# Patient Record
Sex: Female | Born: 1935 | Race: White | Hispanic: No | Marital: Married | Smoking: Former smoker
Health system: Southern US, Community
[De-identification: ages and names within clinical notes are randomized; demographics above are authoritative.]

## PROBLEM LIST (undated history)

## (undated) DIAGNOSIS — H8110 Benign paroxysmal vertigo, unspecified ear: Secondary | ICD-10-CM

## (undated) DIAGNOSIS — D649 Anemia, unspecified: Secondary | ICD-10-CM

## (undated) DIAGNOSIS — I4891 Unspecified atrial fibrillation: Secondary | ICD-10-CM

## (undated) DIAGNOSIS — I82409 Acute embolism and thrombosis of unspecified deep veins of unspecified lower extremity: Secondary | ICD-10-CM

## (undated) DIAGNOSIS — R58 Hemorrhage, not elsewhere classified: Secondary | ICD-10-CM

## (undated) DIAGNOSIS — N281 Cyst of kidney, acquired: Secondary | ICD-10-CM

## (undated) DIAGNOSIS — K219 Gastro-esophageal reflux disease without esophagitis: Secondary | ICD-10-CM

## (undated) DIAGNOSIS — I341 Nonrheumatic mitral (valve) prolapse: Secondary | ICD-10-CM

## (undated) DIAGNOSIS — R079 Chest pain, unspecified: Secondary | ICD-10-CM

## (undated) DIAGNOSIS — E039 Hypothyroidism, unspecified: Secondary | ICD-10-CM

## (undated) HISTORY — PX: TONSILLECTOMY AND ADENOIDECTOMY: SUR1326

## (undated) HISTORY — DX: Cyst of kidney, acquired: N28.1

## (undated) HISTORY — DX: Unspecified atrial fibrillation: I48.91

## (undated) HISTORY — DX: Acute embolism and thrombosis of unspecified deep veins of unspecified lower extremity: I82.409

## (undated) HISTORY — DX: Gastro-esophageal reflux disease without esophagitis: K21.9

## (undated) HISTORY — PX: ENDOMETRIAL BIOPSY: SHX622

## (undated) HISTORY — DX: Hypothyroidism, unspecified: E03.9

## (undated) HISTORY — DX: Benign paroxysmal vertigo, unspecified ear: H81.10

## (undated) HISTORY — DX: Chest pain, unspecified: R07.9

## (undated) HISTORY — DX: Anemia, unspecified: D64.9

## (undated) HISTORY — DX: Nonrheumatic mitral (valve) prolapse: I34.1

## (undated) HISTORY — DX: Hemorrhage, not elsewhere classified: R58

---

## 1996-12-15 HISTORY — PX: OTHER SURGICAL HISTORY: SHX169

## 2000-12-15 HISTORY — PX: SKIN CANCER EXCISION: SHX779

## 2004-01-05 LAB — HM COLONOSCOPY

## 2004-12-15 HISTORY — PX: CATARACT EXTRACTION: SUR2

## 2005-01-02 ENCOUNTER — Ambulatory Visit: Payer: Self-pay | Admitting: Specialist

## 2005-01-23 ENCOUNTER — Ambulatory Visit: Payer: Self-pay | Admitting: Unknown Physician Specialty

## 2006-02-24 ENCOUNTER — Ambulatory Visit: Payer: Self-pay | Admitting: Unknown Physician Specialty

## 2007-02-26 ENCOUNTER — Ambulatory Visit: Payer: Self-pay | Admitting: Unknown Physician Specialty

## 2007-07-29 ENCOUNTER — Ambulatory Visit: Payer: Self-pay | Admitting: Urology

## 2007-10-16 ENCOUNTER — Ambulatory Visit: Payer: Self-pay | Admitting: Internal Medicine

## 2007-10-16 HISTORY — PX: OTHER SURGICAL HISTORY: SHX169

## 2007-11-07 ENCOUNTER — Inpatient Hospital Stay: Payer: Self-pay | Admitting: Internal Medicine

## 2007-11-10 ENCOUNTER — Ambulatory Visit: Payer: Self-pay | Admitting: Internal Medicine

## 2007-11-15 ENCOUNTER — Ambulatory Visit: Payer: Self-pay | Admitting: Internal Medicine

## 2007-11-18 ENCOUNTER — Other Ambulatory Visit: Payer: Self-pay

## 2007-11-18 ENCOUNTER — Inpatient Hospital Stay: Payer: Self-pay | Admitting: Internal Medicine

## 2007-11-25 ENCOUNTER — Ambulatory Visit: Payer: Self-pay | Admitting: Internal Medicine

## 2007-12-16 ENCOUNTER — Ambulatory Visit: Payer: Self-pay | Admitting: Internal Medicine

## 2007-12-17 ENCOUNTER — Ambulatory Visit: Payer: Self-pay | Admitting: Oncology

## 2007-12-21 LAB — CBC WITH DIFFERENTIAL (CANCER CENTER ONLY)
BASO#: 0.1 10*3/uL (ref 0.0–0.2)
EOS%: 2.7 % (ref 0.0–7.0)
Eosinophils Absolute: 0.2 10*3/uL (ref 0.0–0.5)
HCT: 43 % (ref 34.8–46.6)
HGB: 14.6 g/dL (ref 11.6–15.9)
LYMPH#: 1.6 10*3/uL (ref 0.9–3.3)
MCH: 31.3 pg (ref 26.0–34.0)
MCHC: 33.9 g/dL (ref 32.0–36.0)
NEUT%: 63.7 % (ref 39.6–80.0)
RBC: 4.66 10*6/uL (ref 3.70–5.32)

## 2008-01-03 ENCOUNTER — Ambulatory Visit: Payer: Self-pay | Admitting: Unknown Physician Specialty

## 2008-01-24 ENCOUNTER — Ambulatory Visit: Payer: Self-pay | Admitting: Internal Medicine

## 2008-02-09 ENCOUNTER — Encounter: Payer: Self-pay | Admitting: Internal Medicine

## 2008-02-09 ENCOUNTER — Ambulatory Visit: Payer: Self-pay

## 2008-03-21 ENCOUNTER — Ambulatory Visit: Payer: Self-pay | Admitting: Internal Medicine

## 2008-04-04 ENCOUNTER — Ambulatory Visit: Payer: Self-pay | Admitting: Unknown Physician Specialty

## 2008-05-17 ENCOUNTER — Ambulatory Visit: Payer: Self-pay | Admitting: Internal Medicine

## 2009-04-05 ENCOUNTER — Ambulatory Visit: Payer: Self-pay | Admitting: Unknown Physician Specialty

## 2009-05-08 DIAGNOSIS — I4891 Unspecified atrial fibrillation: Secondary | ICD-10-CM | POA: Insufficient documentation

## 2009-05-08 DIAGNOSIS — R001 Bradycardia, unspecified: Secondary | ICD-10-CM

## 2009-05-08 DIAGNOSIS — R079 Chest pain, unspecified: Secondary | ICD-10-CM

## 2009-05-16 ENCOUNTER — Encounter: Payer: Self-pay | Admitting: Internal Medicine

## 2009-05-16 ENCOUNTER — Ambulatory Visit: Payer: Self-pay | Admitting: Internal Medicine

## 2009-06-21 ENCOUNTER — Encounter: Payer: Self-pay | Admitting: Internal Medicine

## 2009-09-13 ENCOUNTER — Ambulatory Visit: Payer: Self-pay | Admitting: Urology

## 2009-12-17 ENCOUNTER — Ambulatory Visit: Payer: Self-pay | Admitting: Unknown Physician Specialty

## 2010-01-11 ENCOUNTER — Ambulatory Visit: Payer: Self-pay | Admitting: Unknown Physician Specialty

## 2010-04-11 ENCOUNTER — Ambulatory Visit: Payer: Self-pay | Admitting: Unknown Physician Specialty

## 2010-11-08 ENCOUNTER — Ambulatory Visit: Payer: Self-pay | Admitting: Internal Medicine

## 2010-11-08 ENCOUNTER — Encounter: Payer: Self-pay | Admitting: Internal Medicine

## 2010-11-11 LAB — CONVERTED CEMR LAB
BUN: 37 mg/dL — ABNORMAL HIGH (ref 6–23)
CO2: 27 meq/L (ref 19–32)
Glucose, Bld: 94 mg/dL (ref 70–99)
Potassium: 4.5 meq/L (ref 3.5–5.3)
Sodium: 139 meq/L (ref 135–145)

## 2010-11-15 ENCOUNTER — Encounter: Payer: Self-pay | Admitting: Internal Medicine

## 2011-01-14 NOTE — Assessment & Plan Note (Signed)
Summary: ROV   Visit Type:  Follow-up Primary Ana Griffith:  Ana Griffith  CC:  Needs to discuss INR level.Marland Kitchen  History of Present Illness: Ana Griffith is a delightful 77 year old woman (sister of Ana Griffith) with history of chronic atrial fibrillation as well as previous thigh DVT in the setting of full anticoagulation with Coumadin.  She is status post IVC filter placement.  She also has a history of chronic hematuria as well as hypertension with a white coat component.  She also has chronic chest pain.  She had a cardiac catheterization 1999 which showed normal coronary arteries.  Myoview 2011 showed ejection fraction of 72% with no ischemia.   Here for routine f/u. Doing well. No CP or SOB. No palpitations. Remains active. INR level very labile - as high as 6.0. Had a little vaginal bleeding last week but nothing major (had hysteroscopy earlier this year showing thickening of uterus). INR followed by Ana Griffith.   Current Medications (verified): 1)  Warfarin Sodium 5 Mg Tabs (Warfarin Sodium) .... Use As Directed By Anticoagulation Clinic 2)  Metoprolol Succinate 25 Mg Xr24h-Tab (Metoprolol Succinate) .... Take One Tablet By Mouth Daily 3)  Synthroid 75 Mcg Tabs (Levothyroxine Sodium) .Marland Kitchen.. 1 By Mouth Once Daily 4)  Calcium 600/vitamin D 600-400 Mg-Unit Chew (Calcium Carbonate-Vitamin D) .Marland Kitchen.. 1 By Mouth Once Daily  Allergies (verified): 1)  ! Codeine  Past History:  Past Medical History: Last updated: 05/16/2009 CHEST PAIN-UNSPECIFIED (ICD-786.50)    --cardiac cath 1999 normal cors    --myoview 2009. EF 72%  no ischemia ATRIAL FIBRILLATION (ICD-427.31) HYPERTENSION, UNSPECIFIED (ICD-401.9) h/o DVT on coumadin  s/p IVC filter Hematuria Hypothyroidismdsa    Past Surgical History: Last updated: 05/08/2009 Cataract Extraction - 2006 T & A  Family History: Last updated: 05/08/2009 Family History of Coronary Artery Disease:   Social History: Last updated:  05/08/2009 Widowed  Tobacco Use - Former. - 1980 Alcohol Use - no Regular Exercise - yes Drug Use - no Retired   Risk Factors: Exercise: yes (05/08/2009)  Risk Factors: Smoking Status: quit (05/08/2009)  Review of Systems       As per HPI and past medical history; otherwise all systems negative.   Vital Signs:  Patient profile:   77 year old female Height:      65 inches Weight:      134.50 pounds BMI:     22.46 Pulse rate:   77 / minute BP sitting:   100 / 68  (left arm) Cuff size:   regular  Vitals Entered By: Ana Griffith, CMA (November 08, 2010 11:28 AM)  Physical Exam  General:  Well appearing. no resp difficulty HEENT: normal Neck: supple. no JVD. Carotids 2+ bilat; not bruits. No lymphadenopathy or thryomegaly appreciated. Cor: PMI nondisplaced. Irregular rate & rhythm. No rubs, gallops, murmur. Lungs: clear Abdomen: soft, nontender, nondistended. No hepatosplenomegaly. No bruits or masses. Good bowel sounds. Extremities: no cyanosis, clubbing, rash, edema Neuro: alert & orientedx3, cranial nerves grossly intact. moves all 4 extremities w/o difficulty. affect pleasant    Impression & Recommendations:  Problem # 1:  ATRIAL FIBRILLATION (ICD-427.31) Difficult situation. Having trouble with labile INRs but no overt bleeding. We discussed the option of switching to Pradaxa at length but I worry that her risk of bleeding with 150 two times a day dosing may be quite high. For now we will recommend that she continues coumadin with close f/u. We will check creatinine today to calculate creatinine clearance. If clearance  ~54ml/min will  consider using Pradax 75 two times a day if we cannot regulate INRs closely. Eventually, apixiban when it is released may be a better option for anticoagulation given its lower risk of bleeding in clinical trials.   Other Orders: T-Basic Metabolic Panel [phone])  Patient Instructions: 1)  Your physician recommends that you  schedule a follow-up appointment in: 4 months 2)  Your physician recommends that you continue on your current medications as directed. Please refer to the Current Medication list given to you today. Continue on your Coumadin for now.

## 2011-03-11 ENCOUNTER — Ambulatory Visit (INDEPENDENT_AMBULATORY_CARE_PROVIDER_SITE_OTHER): Payer: MEDICARE | Admitting: Internal Medicine

## 2011-03-11 ENCOUNTER — Encounter: Payer: Self-pay | Admitting: Internal Medicine

## 2011-03-11 ENCOUNTER — Ambulatory Visit: Payer: Self-pay | Admitting: Internal Medicine

## 2011-03-11 VITALS — BP 104/68 | HR 60 | Ht 64.0 in | Wt 137.0 lb

## 2011-03-11 DIAGNOSIS — I10 Essential (primary) hypertension: Secondary | ICD-10-CM

## 2011-03-11 DIAGNOSIS — I4891 Unspecified atrial fibrillation: Secondary | ICD-10-CM

## 2011-03-11 DIAGNOSIS — R079 Chest pain, unspecified: Secondary | ICD-10-CM

## 2011-03-11 NOTE — Progress Notes (Signed)
HPI: Ms. Pottenger is a delightful 77 year old woman (sister of [name]) with history of chronic atrial fibrillation as well as previous thigh DVT in the setting of full anticoagulation with Coumadin.  She is status post IVC filter placement.  She also has a history of chronic hematuria as well as hypertension with a white coat component.  She also has chronic chest pain.  She had a cardiac catheterization 1999 which showed normal coronary arteries.  Myoview 2011 showed ejection fraction of 72% with no ischemia.  Here for routine f/u. Doing very well. Denies any CP or SOB. Remains active. INR level remains labile. No further vaginal (or other) bleeding. Dr. Barron Alvine adjusting coumadin. No edema palpitations.    ROS: All systems negative except as listed in HPI, PMH and Problem List.  Past Medical History  Diagnosis Date  . Chest pain, unspecified     cardiac cath 1999 - normal cors. Myoview 2009 EF 72. normal perfusion  . Atrial fibrillation     chronic  . Hypertension   . DVT (deep venous thrombosis)     In setting of full anti-coagulation. IVC filter placed  . Hematuria   . Hypothyroidism     Current Outpatient Prescriptions  Medication Sig Dispense Refill  . Calcium Carbonate-Vitamin D (CALCIUM 600/VITAMIN D) 600-400 MG-UNIT per tablet Take 1 tablet by mouth daily.        Marland Kitchen levothyroxine (SYNTHROID, LEVOTHROID) 75 MCG tablet Take 75 mcg by mouth daily.        . metoprolol succinate (TOPROL-XL) 25 MG 24 hr tablet Take 25 mg by mouth daily.        Marland Kitchen warfarin (COUMADIN) 5 MG tablet Take 5 mg by mouth as directed.           PHYSICAL EXAM: Filed Vitals:   03/11/11 1415  BP: 104/68  Pulse: 60  General:  Well appearing. no resp difficulty HEENT: normal Neck: supple. no JVD. Carotids 2+ bilat; not bruits. No lymphadenopathy or thryomegaly appreciated. Cor: PMI nondisplaced. Irregular rate & rhythm. No rubs, gallops, murmur. Lungs: clear Abdomen: soft, nontender, nondistended. No  hepatosplenomegaly. No bruits or masses. Good bowel sounds. Extremities: no cyanosis, clubbing, rash, edema Neuro: alert & orientedx3, cranial nerves grossly intact. moves all 4 extremities w/o difficulty. affect pleasant   ECG: Atrial fib 60 No ST-T wave abnormalities.     ASSESSMENT & PLAN:

## 2011-03-11 NOTE — Assessment & Plan Note (Signed)
BP looks great. Continue current regimen. 

## 2011-03-11 NOTE — Assessment & Plan Note (Signed)
Chronic. We did discuss switch over to Pradaxa but given her age (and h/o PE) I do not favor using the 150 bid dose due to risk of bleeding and 75 bid would not be sufficient. Continue coumadin.

## 2011-03-11 NOTE — Patient Instructions (Addendum)
Your physician recommends that you schedule a follow-up appointment in: 9 months  

## 2011-03-11 NOTE — Assessment & Plan Note (Signed)
Resolved. Myoview reassuring.

## 2011-04-29 NOTE — Assessment & Plan Note (Signed)
[facility] OFFICE NOTE   Ana Griffith, Ana Griffith                            MRN:          [id number]  DATE:01/24/2008                            DOB:          12-Apr-1924    REFERRING PHYSICIAN:  Francia Greaves, MD   REASON FOR CONSULTATION:  Atrial fibrillation.   HISTORY OF PRESENT ILLNESS:  Ms.  Griffith is a very pleasant 77 year old  woman with no known history of coronary artery disease.  She did have a  cardiac catheterization back in 1999, which showed no significant  coronary artery disease.  Apparently, she developed atrial fibrillation  in the postoperative setting after cataract surgery in 2006.  She has  had chronic atrial fibrillation ever since and has been maintained on  Coumadin.   She did have an echocardiogram in November 2006, which showed normal LV  function with biatrial enlargement and moderate to severe mitral and  tricuspid regurgitation with mild aortic insufficiency.  Stress echo at  that time was also normal.   In November 2008, she went on a three day bus trip with her sister and  developed left thigh DVT in the setting of an INR of 3.0.  She underwent  placement of IVC filter and unfortunately procedure was complicated by  retroperitoneal bleed.  She was quite anemic, but does seem to have  recovered from that.   She comes today really to see if she needs to continue her Coumadin and  how else to manage her atrial fibrillation.   She denies any palpitations.  No syncope or pre-syncope.  She used to  walk down to the track every day but has not done that much since her  DVT.  She does say that when she tries to walk at a faster pace that she  notices chest pressure which is relatively new for her over the past  year or so.  She has not had any resting angina.   REVIEW OF SYSTEMS:  Is notable for anemia and thyroid disease.  Her main  review of systems is negative; for full review please  see intake note,  which I have reviewed carefully.   PAST MEDICAL HISTORY:  1. Chronic atrial fibrillation dating back to 2006.      a.     Echocardiogram in November 2006 showed normal left       ventricular function with moderate to severe mitral and tricuspid       regurgitation, mild aortic insufficiency.  2. History of chest pain.      a.     Negative cardiac catheterization 1999 with question of small       misformed artery, details unclear.      b.     Negative stress echo in 2006.  3. Chronic obstructive pulmonary disease.  4. Left thigh deep vein thrombosis in November 2008 status post IVC      filter.  5. History of cataracts.   She denies any history of diabetes, hypertension, heart failure or  previous stroke.   CURRENT MEDICATIONS:  1. Coumadin.  2. Metoprolol 25 mg a day.  3. Synthroid 75 mcg.  4. Calcium and vitamin D,   ALLERGIES/INTOLERANCES:  CODEINE WHICH CAUSE NAUSEA.   PHYSICAL EXAM:  She is an elderly woman who is somewhat frail, but in  general looks younger than her stated age.  Blood pressure is 124/78,  heart rate 77, weight 116.  HEENT is normal.  NECK: Is supple.  There is no JVD.  Carotids are 2+ bilaterally without  bruits.  There is no lymphadenopathy or thyromegaly.  CARDIAC:  Regular regular.  No murmurs, rubs or gallops.  PMI is  nondisplaced.  LUNGS:  Clear.  ABDOMEN:  Soft, nontender, nondistended.  There is no  hepatosplenomegaly.  No bruits, no masses.  Good bowel sounds.  EXTREMITIES:  Warm with no cyanosis, clubbing or edema.  No rash.  NEURO:  Alert and oriented x3.  Cranial nerves II-XII are intact.  Moves  all four extremities without difficulty.  Affect is pleasant.   EKG shows atrial fibrillation with ventricular response of 77 beats per  minute.  Nonspecific ST-T wave abnormalities laterally.  No old EKG for  comparison.   ASSESSMENT/PLAN:  1. Chronic atrial fibrillation.  This is relatively asymptomatic.  Her      CHADs  (2) score is 1, which makes Coumadin sort of a borderline      call for her.  However, given her history of deep vein thrombosis,      I think she absolutely needs lifelong Coumadin.  I have explained      this to her.  2. Exertional chest pressure.  I am not sure if this is deconditioning      or her atrial fibrillation, but certainly is concerning to me for      possible angina.  We will go ahead and get an adenosine Myoview to      further evaluate.  3. History of mitral and tricuspid regurgitation.  She will need a      followup echocardiogram.   DISPOSITION:  We will see her back in several weeks after her imaging  studies are complete.     Bevelyn Buckles. Bensimhon, MD  Electronically Signed    DRB/MedQ  DD: 01/24/2008  DT: 01/24/2008  Job #: [id number]

## 2011-04-29 NOTE — Assessment & Plan Note (Signed)
[facility] OFFICE NOTE   Ana Griffith, Ana Griffith                            MRN:          [id number]  DATE:03/21/2008                            DOB:          05-25-1924    INTERVAL HISTORY:  Ana Griffith is a very pleasant, 77 year old woman with a  history of chronic atrial fibrillation as well as previous thigh DVT in  the setting of full anticoagulation with Coumadin.  She is status post  IVC filter placement which was complicated by retroperitoneal bleed in  2008.  She also has a history of hypertension with questioned white coat  component, COPD and chronic hematuria.  She returns today for routine  followup.   At her last visit, she complained of some exertional chest pain.  She  underwent a Myoview which showed an ejection fraction of 72% and no  significant ischemia.  Echocardiogram showed a normal ejection fraction  with mild to moderate tricuspid regurgitation and moderate mitral  regurgitation.   She returns today for routine followup.  She continues to be frustrated  with the Coumadin.  She also is having a chronic hematuria which started  Ana Griffith before her Coumadin therapy.  She has had extensive workup including  CAT scans and cystoscopy.  The only thing they found was renal cysts  which they do not think is the source of hematuria.   From a functional point of view, she is doing fairly well.  She  continues to walk about a mile and a quarter to a mile and a half a day.  She says she gets some chest burning when she walks.  She denies any  further chest pressure, no dyspnea.   CURRENT MEDICATIONS:  1. Coumadin.  2. Toprol 25 a day.  3. Synthroid 75 mcg a day.  4. Calcium.   PHYSICAL EXAMINATION:  GENERAL:  She is well-appearing.  She looks  younger than her stated age.  VITAL SIGNS:  Blood pressure is 138/76, heart rate 68, weight 125.  HEENT:  Normal.  NECK:  Supple.  There is no JVD.  Carotids are 2+  bilaterally without  bruits.  There is no lymphadenopathy or thyromegaly.  CARDIAC:  PMI is  nondisplaced.  She is irregular with a 2/6 systolic ejection murmur at  the left sternal border.  LUNGS:  Clear.  ABDOMEN:  Soft, nontender, nondistended.  No bruits, no masses  appreciated.  EXTREMITIES:  Warm with no cyanosis, clubbing or edema.  NEUROLOGIC:  She is alert and x3.  Cranial nerves 2-12 intact.  Moves  all four extremities without difficulty.  Affect is very pleasant.   ASSESSMENT/PLAN:  1. Atrial fibrillation.  This is chronic.  She has a [place] score of 2      which includes her hypertension and age.  Thus, I think lifetime      chronic Coumadin therapy is indicated.  This is also true in light      of her deep venous thrombosis.  She has had hematuria, but her      hemoglobins  remain quite stable and I do not think this should pose      a significant long-term risk.  2. Hypertension.  This is followed by Dr. Lin Givens.  It appears she      has a significant white coat component.  3. Exertional chest discomfort.  Her Myoview and previous      catheterizations are reassuring.  I told her if this should      progress or get more severe, we may need to consider a      catheterization, but we will continue medical therapy at this time.     Bevelyn Buckles. Bensimhon, MD  Electronically Signed    DRB/MedQ  DD: 03/21/2008  DT: 03/21/2008  Job #: [id number]   cc:   Terrial Rhodes, M.D.  Francia Greaves, M.D.

## 2011-04-29 NOTE — Assessment & Plan Note (Signed)
[facility] OFFICE NOTE   Ana Griffith, Ana Griffith                            MRN:          [id number]  DATE:05/17/2008                            DOB:          08-Apr-1924    PRIMARY CARE PHYSICIAN:  Dr. Francia Greaves.   HISTORY:  Ms. Spizzirri is a delightful 77 year old woman with history of  chronic atrial fibrillation as well as previous thigh DVT in the setting  of full anticoagulation with Coumadin.  She is status post IVC filter  placement.  She also has a history of chronic hematuria as well as  hypertension with a white coat component.  She also has chronic chest  pain.  She had a cardiac catheterization 1999 which showed normal  coronary arteries.  Myoview early this year showed ejection fraction of  72% with no ischemia.   Overall, she is doing well.  She denies any shortness of breath.  She  does say when she walks too fast she feels some chest pressure, but this  is chronic.  There has been no change.  She sits down and it goes away.  She has never had any resting symptoms.  She does not feel like this is  very limiting for her.  She does say that her blood pressure have been  well controlled and she has been able to do all her activities of daily  living without a problem.   CURRENT MEDICATIONS:  1. Coumadin.  2. Metoprolol 25 a day.  3. Synthroid 75 mcg a day.  4. Calcium and vitamin D.   PHYSICAL EXAMINATION:  GENERAL:  She is an elderly woman, well-  appearing, no acute distress.  Ambulates around the clinic without  respiratory difficulty.  VITAL SIGNS:  Blood pressure 104/70, heart rate 52, weight 126.  HEENT:  Normal.  NECK:  Supple.  No JVD, carotids are 2+ bilaterally without bruits.  There is no lymphadenopathy or thyromegaly.  CARDIAC:  PMI is nondisplaced.  She is mildly irregular with 2/6  systolic ejection murmur at the left sternal border.  LUNGS:  Clear.  ABDOMEN:  Soft, nontender,  nondistended.  No bruits, no masses.  EXTREMITIES:  Warm with cyanosis, clubbing or edema.  NEUROLOGICAL:  Alert and x3.  Cranial nerves II-XII are intact.  Moves  all four extremities without difficulty.  Affect is pleasant.   ASSESSMENT/PLAN:  1. Atrial fibrillation.  This is chronic.  Continue Coumadin which is      good rate control.  2. Hypertension, well-controlled.  3. Chest discomfort.  This is stable.  There is no clear evidence of      ischemia.  She is not interested in cardiac catheterization.   DISPOSITION:  She is doing well.  Continue yearly follow-up.     Bevelyn Buckles. Bensimhon, MD  Electronically Signed    DRB/MedQ  DD: 05/17/2008  DT: 05/17/2008  Job #: [id number]   cc:   Dr. Francia Greaves

## 2011-07-09 ENCOUNTER — Ambulatory Visit: Payer: Self-pay | Admitting: Unknown Physician Specialty

## 2011-07-24 ENCOUNTER — Ambulatory Visit: Payer: Self-pay | Admitting: Otolaryngology

## 2013-01-04 ENCOUNTER — Encounter: Payer: Self-pay | Admitting: Internal Medicine

## 2013-01-04 ENCOUNTER — Ambulatory Visit (INDEPENDENT_AMBULATORY_CARE_PROVIDER_SITE_OTHER): Payer: 59 | Admitting: Internal Medicine

## 2013-01-04 VITALS — BP 102/70 | HR 62 | Temp 97.7°F | Ht 65.0 in | Wt 122.8 lb

## 2013-01-04 DIAGNOSIS — E039 Hypothyroidism, unspecified: Secondary | ICD-10-CM

## 2013-01-04 DIAGNOSIS — R42 Dizziness and giddiness: Secondary | ICD-10-CM

## 2013-01-04 DIAGNOSIS — Z139 Encounter for screening, unspecified: Secondary | ICD-10-CM

## 2013-01-04 DIAGNOSIS — D649 Anemia, unspecified: Secondary | ICD-10-CM

## 2013-01-04 DIAGNOSIS — R079 Chest pain, unspecified: Secondary | ICD-10-CM

## 2013-01-04 DIAGNOSIS — K562 Volvulus: Secondary | ICD-10-CM

## 2013-01-04 DIAGNOSIS — E7800 Pure hypercholesterolemia, unspecified: Secondary | ICD-10-CM

## 2013-01-04 DIAGNOSIS — K219 Gastro-esophageal reflux disease without esophagitis: Secondary | ICD-10-CM | POA: Insufficient documentation

## 2013-01-04 DIAGNOSIS — N83209 Unspecified ovarian cyst, unspecified side: Secondary | ICD-10-CM

## 2013-01-04 DIAGNOSIS — R319 Hematuria, unspecified: Secondary | ICD-10-CM

## 2013-01-04 DIAGNOSIS — I4891 Unspecified atrial fibrillation: Secondary | ICD-10-CM

## 2013-01-04 DIAGNOSIS — I10 Essential (primary) hypertension: Secondary | ICD-10-CM

## 2013-01-09 ENCOUNTER — Encounter: Payer: Self-pay | Admitting: Internal Medicine

## 2013-01-09 DIAGNOSIS — R42 Dizziness and giddiness: Secondary | ICD-10-CM | POA: Insufficient documentation

## 2013-01-09 NOTE — Assessment & Plan Note (Signed)
Has a history of iron deficient anemia.  Recheck cbc and ferritin.

## 2013-01-09 NOTE — Assessment & Plan Note (Signed)
Followed by Dr Gwen Pounds.  Rate controlled.  On Toprol and coumadin.  Cardiology following pt/inr.

## 2013-01-09 NOTE — Assessment & Plan Note (Signed)
Chronic/recurrent.  Worked up by Dr Cope.  Negative work up except for stable cysts.  Follow.    

## 2013-01-09 NOTE — Progress Notes (Signed)
Subjective:    Patient ID: Ana Griffith, female    DOB: September 15, 1924, 79 y.o.   MRN: [id number]  HPI 79 year old female with past history of atrial fib, BPV, hypothyroidism, hypertension and anemia.  She comes in today to follow up on these issues as well as to establish care.  Sees Dr Gwen Pounds for her afib.  On coumadin.  He follows her levels.  Has been stable.   Has some issues with BPV.  Sees Dr Elenore Rota.  Had mri - ok.  Some decreased hearting.  No dizziness now.  Sees Dr Adolphus Birchwood for her skin cancer.  Had lesion on her nose.  Dr Chestine Spore did reconstructive surgery.  Sees Dr Achilles Dunk for hematuria.  Was being followed by Dr Harold Hedge for her gyn care.  No increased heart rate or palpitations.  Breathing stable.  Eating and drinking well.    Past Medical History  Diagnosis Date  . Chest pain, unspecified     cardiac cath 1999 - normal cors. Myoview 2009 EF 72. normal perfusion  . Atrial fibrillation     chronic  . Hypertension   . DVT (deep venous thrombosis)     In setting of full anti-coagulation. IVC filter placed  . Hematuria   . Hypothyroidism   . Anemia     iron deficient  . GERD (gastroesophageal reflux disease)   . Benign positional vertigo   . Mitral valve prolapse   . Intraabdominal hemorrhage     lovenox therapy  . Bilateral renal cysts     Outpatient Encounter Prescriptions as of 01/04/2013  Medication Sig Dispense Refill  . Calcium Carbonate-Vitamin D (CALCIUM 600/VITAMIN D) 600-400 MG-UNIT per tablet Take 1 tablet by mouth daily.        Marland Kitchen levothyroxine (SYNTHROID) 75 MCG tablet Take 75 mcg by mouth daily. Name brand only      . metoprolol succinate (TOPROL-XL) 25 MG 24 hr tablet Take 1/2 tablet by mouth a day.      . warfarin (COUMADIN) 1 MG tablet Take 1/2 tablet by mouth daily      . warfarin (COUMADIN) 6 MG tablet Take 6 mg by mouth daily.      . [DISCONTINUED] levothyroxine (SYNTHROID, LEVOTHROID) 75 MCG tablet Take 75 mcg by mouth daily.        . [DISCONTINUED] warfarin  (COUMADIN) 5 MG tablet Take 5 mg by mouth as directed.           Review of Systems Patient denies any headache, lightheadedness or dizziness currently.  See above.  No significant sinus or allergy symptoms.   No chest pain, tightness or palpitations.  No increased shortness of breath, cough or congestion.  No nausea or vomiting.  No acid reflux.  No abdominal pain or cramping.  No bowel change, such as diarrhea, constipation, BRBPR or melana.  No urine change.        Objective:   Physical Exam  Filed Vitals:   01/04/13 0934  BP: 102/70  Pulse: 62  Temp: 97.7 F (36.5 C)   Blood pressure recheck:  122/68, pulse 24  79 year old female in no acute distress.   HEENT:  Nares- clear.  Oropharynx - without lesions. NECK:  Supple.  Nontender.  No audible bruit.  HEART:  Rate controlled.   LUNGS:  No crackles or wheezing audible.  Respirations even and unlabored.  RADIAL PULSE:  Equal bilaterally.   ABDOMEN:  Soft, nontender.  Bowel sounds present and normal.  No audible abdominal  bruit.    EXTREMITIES:  No increased edema present.  DP pulses palpable and equal bilaterally.    NEURO:  No focal neuro deficits appreciated on exam.  Some hearing loss.        Assessment & Plan:  FATIGUE.  Did report some fatigue.  Overall doing well.  Check cbc, met c and tsh.  HEALTH MAINTENANCE.  Schedule a physical for next visit.  Discuss further health maintenance issues with her then.  Colonoscopy 02/07/04 - single non bleeding colonic angiectasia, diverticulosis and internal hemorrhoids.  Pneumovax 09/30/10.  Mammogram 07/09/11 - Birads II.  Schedule a follow up mammogram.

## 2013-01-09 NOTE — Assessment & Plan Note (Signed)
On replacement.  Check tsh.    

## 2013-01-09 NOTE — Assessment & Plan Note (Signed)
Blood pressure under good control.  Same medications.  Follow.  Check metabolic panel.

## 2013-01-09 NOTE — Assessment & Plan Note (Signed)
No symptoms.  Follow.   

## 2013-01-09 NOTE — Assessment & Plan Note (Signed)
No symptoms now.  Saw ENT. MRI negative.  Follow.

## 2013-01-10 ENCOUNTER — Other Ambulatory Visit: Payer: Self-pay | Admitting: *Deleted

## 2013-01-11 ENCOUNTER — Other Ambulatory Visit (INDEPENDENT_AMBULATORY_CARE_PROVIDER_SITE_OTHER): Payer: 59

## 2013-01-11 DIAGNOSIS — E7800 Pure hypercholesterolemia, unspecified: Secondary | ICD-10-CM

## 2013-01-11 DIAGNOSIS — I10 Essential (primary) hypertension: Secondary | ICD-10-CM

## 2013-01-11 DIAGNOSIS — E039 Hypothyroidism, unspecified: Secondary | ICD-10-CM

## 2013-01-11 DIAGNOSIS — D649 Anemia, unspecified: Secondary | ICD-10-CM

## 2013-01-11 LAB — CBC WITH DIFFERENTIAL/PLATELET
Basophils Relative: 0.5 % (ref 0.0–3.0)
Eosinophils Relative: 4.3 % (ref 0.0–5.0)
HCT: 36 % (ref 36.0–46.0)
Lymphs Abs: 1.4 10*3/uL (ref 0.7–4.0)
MCHC: 33.5 g/dL (ref 30.0–36.0)
MCV: 95.8 fl (ref 78.0–100.0)
Monocytes Absolute: 0.5 10*3/uL (ref 0.1–1.0)
Neutro Abs: 2.7 10*3/uL (ref 1.4–7.7)
RBC: 3.76 Mil/uL — ABNORMAL LOW (ref 3.87–5.11)
WBC: 4.8 10*3/uL (ref 4.5–10.5)

## 2013-01-11 LAB — LIPID PANEL
Cholesterol: 165 mg/dL (ref 0–200)
HDL: 43 mg/dL (ref >39.00)
Triglycerides: 107 mg/dL (ref 0.0–149.0)
VLDL: 21.4 mg/dL (ref 0.0–40.0)

## 2013-01-11 LAB — COMPREHENSIVE METABOLIC PANEL
Albumin: 3.8 g/dL (ref 3.5–5.2)
Alkaline Phosphatase: 67 U/L (ref 39–117)
BUN: 26 mg/dL — ABNORMAL HIGH (ref 6–23)
Creatinine, Ser: 1.1 mg/dL (ref 0.4–1.2)
Glucose, Bld: 85 mg/dL (ref 70–99)
Total Bilirubin: 0.8 mg/dL (ref 0.3–1.2)

## 2013-01-11 LAB — TSH: TSH: 2.48 u[IU]/mL (ref 0.35–5.50)

## 2013-01-13 MED ORDER — SYNTHROID 75 MCG PO TABS: 75.0000 ug | ORAL_TABLET | Freq: Every day | ORAL | Status: DC

## 2013-01-13 MED ORDER — LEVOTHYROXINE SODIUM 75 MCG PO TABS: 75.0000 ug | ORAL_TABLET | Freq: Every day | ORAL | Status: DC

## 2013-01-13 NOTE — Telephone Encounter (Signed)
Sent in to pharmacy.  

## 2013-02-03 ENCOUNTER — Ambulatory Visit: Payer: Self-pay | Admitting: Internal Medicine

## 2013-02-21 ENCOUNTER — Encounter: Payer: Self-pay | Admitting: Internal Medicine

## 2013-03-21 ENCOUNTER — Telehealth: Payer: Self-pay | Admitting: *Deleted

## 2013-03-21 NOTE — Telephone Encounter (Signed)
Called [phone] for prior authorization on the Synthroid, form is being faxed over now   Case number: [id number]

## 2013-03-25 ENCOUNTER — Telehealth: Payer: Self-pay

## 2013-03-25 NOTE — Telephone Encounter (Signed)
Called pt and informed her that were trying to get authorization for her synthroid. And she said that she has not tried any other thyroid medication. And if you recommend any other medication she will take it.

## 2013-03-26 NOTE — Telephone Encounter (Signed)
I completed the form for prior authorization for her synthroid.  It is in the prior authorization folder on the nurses desk.

## 2013-03-29 ENCOUNTER — Telehealth: Payer: Self-pay | Admitting: *Deleted

## 2013-03-29 NOTE — Telephone Encounter (Signed)
Prior Authorization criteria question form for Synthroid has been faxed to [phone]

## 2013-04-04 ENCOUNTER — Telehealth: Payer: Self-pay

## 2013-04-04 DIAGNOSIS — E039 Hypothyroidism, unspecified: Secondary | ICD-10-CM

## 2013-04-04 MED ORDER — SYNTHROID 75 MCG PO TABS: 75.0000 ug | ORAL_TABLET | Freq: Every day | ORAL | Status: DC

## 2013-04-04 NOTE — Telephone Encounter (Signed)
Notified patient that her insurance is denying coverage for synthroid. Pt said that she is okay with taking the generic synthroid.

## 2013-04-04 NOTE — Telephone Encounter (Signed)
Notified patient that her prescription was sent to the pharmacy. And I set her lab appointment for June 3rd non fasting lab

## 2013-04-04 NOTE — Telephone Encounter (Signed)
I sent if refill to [facility] for generic synthroid.  Since changing, I will need her to come in for a tsh (thyroid function) check.  Please schedule her for a non fasting lab appt in 6-8 weeks.

## 2013-04-07 ENCOUNTER — Encounter: Payer: Self-pay | Admitting: Internal Medicine

## 2013-04-07 ENCOUNTER — Ambulatory Visit (INDEPENDENT_AMBULATORY_CARE_PROVIDER_SITE_OTHER): Payer: 59 | Admitting: Internal Medicine

## 2013-04-07 VITALS — BP 120/70 | HR 64 | Temp 97.8°F | Ht 63.75 in | Wt 121.8 lb

## 2013-04-07 DIAGNOSIS — I4891 Unspecified atrial fibrillation: Secondary | ICD-10-CM

## 2013-04-07 DIAGNOSIS — D649 Anemia, unspecified: Secondary | ICD-10-CM

## 2013-04-07 DIAGNOSIS — R001 Bradycardia, unspecified: Secondary | ICD-10-CM

## 2013-04-07 DIAGNOSIS — K219 Gastro-esophageal reflux disease without esophagitis: Secondary | ICD-10-CM

## 2013-04-07 DIAGNOSIS — R42 Dizziness and giddiness: Secondary | ICD-10-CM

## 2013-04-07 DIAGNOSIS — R319 Hematuria, unspecified: Secondary | ICD-10-CM

## 2013-04-07 DIAGNOSIS — I498 Other specified cardiac arrhythmias: Secondary | ICD-10-CM

## 2013-04-07 DIAGNOSIS — E039 Hypothyroidism, unspecified: Secondary | ICD-10-CM

## 2013-04-07 LAB — BASIC METABOLIC PANEL
CO2: 29 mEq/L (ref 19–32)
Chloride: 99 mEq/L (ref 96–112)
Creatinine, Ser: 1.1 mg/dL (ref 0.4–1.2)
Potassium: 4.5 mEq/L (ref 3.5–5.1)
Sodium: 135 mEq/L (ref 135–145)

## 2013-04-07 NOTE — Progress Notes (Signed)
Subjective:    Patient ID: Ana Griffith, female    DOB: 1924/07/07, 79 y.o.   MRN: [id number]  HPI 79 year old female with past history of hypertension, atrial fib, GERD and hypothyroidism who comes in today for a scheduled follow up .  Was initially scheduled for a physical, but she has a holter monitor in place and wants to postpone physical until next visit.  Saw Dr Gwen Pounds yesterday.  Heart rate was low.  She did report some fatigue.  Has a Holter monitor on now.  He also decreased her toprol to 1/2 (25 mg).  Is planning for an ECHO 4/28.  States overall she feels things are stable.  Eating and drinking.  Bowels stable.     Past Medical History  Diagnosis Date  . Chest pain, unspecified     cardiac cath 1999 - normal cors. Myoview 2009 EF 72. normal perfusion  . Atrial fibrillation     chronic  . DVT (deep venous thrombosis)     In setting of full anti-coagulation. IVC filter placed  . Hematuria   . Hypothyroidism   . Anemia     iron deficient  . GERD (gastroesophageal reflux disease)   . Benign positional vertigo   . Mitral valve prolapse   . Intraabdominal hemorrhage     lovenox therapy  . Bilateral renal cysts     Current Outpatient Prescriptions on File Prior to Visit  Medication Sig Dispense Refill  . Calcium Carbonate-Vitamin D (CALCIUM 600/VITAMIN D) 600-400 MG-UNIT per tablet Take 1 tablet by mouth daily.        . metoprolol succinate (TOPROL-XL) 25 MG 24 hr tablet Take 1/2 tablet by mouth a day.      Marland Kitchen SYNTHROID 75 MCG tablet Take 1 tablet (75 mcg total) by mouth daily.  30 tablet  5  . warfarin (COUMADIN) 1 MG tablet Take 1/2 tablet by mouth on Mon, Wed, & Friday      . warfarin (COUMADIN) 6 MG tablet Take 6 mg by mouth daily.       No current facility-administered medications on file prior to visit.    Review of Systems Patient denies any headache, lightheadedness or dizziness.  No sinus or allergy symptoms.  No chest pain, tightness or palpitations.  Apparent  concern over bradycardia.  Has holter on now.  No increased shortness of breath, cough or congestion.  No nausea or vomiting.  No abdominal pain or cramping.  No bowel change, such as diarrhea, constipation, BRBPR or melana.  No urine change.        Objective:   Physical Exam Filed Vitals:   04/07/13 0914  BP: 120/70  Pulse: 64  Temp: 97.8 F (45.36 C)   79 year old female in no acute distress.   HEENT:  Nares- clear.  Oropharynx - without lesions. NECK:  Supple.  Nontender.  No audible bruit.  HEART:  Appears to be irregular.  Ventricular rate 60.  LUNGS:  No crackles or wheezing audible.  Respirations even and unlabored.  RADIAL PULSE:  Equal bilaterally.  ABDOMEN:  Soft, nontender.  Bowel sounds present and normal.  No audible abdominal bruit.   EXTREMITIES:  No increased edema present.  DP pulses palpable and equal bilaterally.          Assessment & Plan:  HEALTH MAINTENANCE.  Will schedule a physical next visit.  Colonoscopy 02/07/04 - single non bleeding colonic angioectasia, diverticulosis and internal hemorrhoids.  Pneumovax 09/30/10.  Mammogram 02/03/13 - Birads  II.

## 2013-04-10 ENCOUNTER — Encounter: Payer: Self-pay | Admitting: Internal Medicine

## 2013-04-10 NOTE — Assessment & Plan Note (Addendum)
Saw Dr Gwen Pounds.  Has a holter on today.  Toprol decreased to 1/2 tablet.  Continue follow up with cardiology to complete the work up.

## 2013-04-10 NOTE — Assessment & Plan Note (Signed)
No symptoms now.  Saw ENT. MRI negative.  Follow.

## 2013-04-10 NOTE — Assessment & Plan Note (Signed)
Has a history of iron deficient anemia.  Follow cbc and ferritin.   

## 2013-04-10 NOTE — Assessment & Plan Note (Signed)
No symptoms.  Follow.   

## 2013-04-10 NOTE — Assessment & Plan Note (Signed)
Chronic/recurrent.  Worked up by Dr Cope.  Negative work up except for stable cysts.  Follow.    

## 2013-04-10 NOTE — Assessment & Plan Note (Signed)
On replacement.  Follow tsh.  

## 2013-04-10 NOTE — Assessment & Plan Note (Signed)
Followed by Dr Gwen Pounds.  Rate controlled.  On Toprol and coumadin.  Cardiology following pt/inr.

## 2013-04-11 ENCOUNTER — Telehealth: Payer: Self-pay | Admitting: *Deleted

## 2013-04-11 MED ORDER — SYNTHROID 75 MCG PO TABS: ORAL_TABLET | ORAL | Status: DC

## 2013-04-11 NOTE — Telephone Encounter (Signed)
It should have been sent in on 04/08/13.  Where does she need it sent - maybe sent to wrong place.

## 2013-04-11 NOTE — Telephone Encounter (Signed)
rx sent in for generic synthroid

## 2013-04-11 NOTE — Telephone Encounter (Signed)
Patient is requesting a generic substitution for Synthroid . Insurance will no longer cover the brand name

## 2013-04-11 NOTE — Telephone Encounter (Signed)
It was sent in the same [place] (as name brand) per pharmacy- [facility] faxed a request for generic again today

## 2013-05-17 ENCOUNTER — Other Ambulatory Visit (INDEPENDENT_AMBULATORY_CARE_PROVIDER_SITE_OTHER): Payer: 59

## 2013-05-17 DIAGNOSIS — E039 Hypothyroidism, unspecified: Secondary | ICD-10-CM

## 2013-05-17 LAB — TSH: TSH: 1.24 u[IU]/mL (ref 0.35–5.50)

## 2013-05-18 ENCOUNTER — Encounter: Payer: Self-pay | Admitting: *Deleted

## 2013-06-30 ENCOUNTER — Encounter: Payer: Self-pay | Admitting: Internal Medicine

## 2013-07-14 ENCOUNTER — Telehealth: Payer: Self-pay | Admitting: *Deleted

## 2013-07-14 ENCOUNTER — Ambulatory Visit (INDEPENDENT_AMBULATORY_CARE_PROVIDER_SITE_OTHER): Payer: 59 | Admitting: Internal Medicine

## 2013-07-14 ENCOUNTER — Encounter: Payer: Self-pay | Admitting: Internal Medicine

## 2013-07-14 VITALS — BP 120/70 | HR 65 | Temp 98.5°F | Ht 64.2 in | Wt 118.5 lb

## 2013-07-14 DIAGNOSIS — K219 Gastro-esophageal reflux disease without esophagitis: Secondary | ICD-10-CM

## 2013-07-14 DIAGNOSIS — I4891 Unspecified atrial fibrillation: Secondary | ICD-10-CM

## 2013-07-14 DIAGNOSIS — D649 Anemia, unspecified: Secondary | ICD-10-CM

## 2013-07-14 DIAGNOSIS — R001 Bradycardia, unspecified: Secondary | ICD-10-CM

## 2013-07-14 DIAGNOSIS — R319 Hematuria, unspecified: Secondary | ICD-10-CM

## 2013-07-14 DIAGNOSIS — I498 Other specified cardiac arrhythmias: Secondary | ICD-10-CM

## 2013-07-14 DIAGNOSIS — L989 Disorder of the skin and subcutaneous tissue, unspecified: Secondary | ICD-10-CM

## 2013-07-14 DIAGNOSIS — E039 Hypothyroidism, unspecified: Secondary | ICD-10-CM

## 2013-07-14 DIAGNOSIS — R42 Dizziness and giddiness: Secondary | ICD-10-CM

## 2013-07-14 NOTE — Telephone Encounter (Signed)
Pt returned Amber's call (could not understand her message). I informed pt the Dr. Chestine Spore is no longer a part of [facility] & Jodie (audiologist) is still with [facility]. Pt states that she would like to stay with [facility] so that she can continue to see Jodie (see's her for her hearing aids). I informed pt that someone will be in touch with her soon with an appt.

## 2013-07-17 ENCOUNTER — Encounter: Payer: Self-pay | Admitting: Internal Medicine

## 2013-07-17 NOTE — Assessment & Plan Note (Signed)
Followed by Dr Gwen Pounds.  On coumadin.  Cardiology following pt/inr.  Having issues with dizziness.  Noted bradycardia and episodes of increased heart rate.  Cardiology has discussed possible pacemaker. She declines at this time.  Wants to pursue ENT evaluation first.

## 2013-07-17 NOTE — Assessment & Plan Note (Signed)
No symptoms.  Follow.   

## 2013-07-17 NOTE — Progress Notes (Signed)
Subjective:    Patient ID: Ana Griffith, female    DOB: [birth year], 79 y.o.   MRN: [id number]  HPI 79 year old female with past history of hypertension, atrial fib, GERD and hypothyroidism who comes in today to follow up on these issues as well as for a complete physical exam.   Seeing cardiology.  Having issues with continued dizziness.  Found to have intermittent episodes of bradycardia with increased heart rate.  She is now off her beta blocker.  Cardiology has discussed with her regarding pacemaker placemetn.  She is not ready to proceed with this, because she is not convinced that all her dizziness is related to her heart.  She has a history of "vertigo".  Has seen ENT and required Epley maneuvers previously.  No chest pain or tightness.  No nausea or vomiting.  No bowel change.     Past Medical History  Diagnosis Date  . Chest pain, unspecified     cardiac cath 1999 - normal cors. Myoview 2009 EF 72. normal perfusion  . Atrial fibrillation     chronic  . DVT (deep venous thrombosis)     In setting of full anti-coagulation. IVC filter placed  . Hematuria   . Hypothyroidism   . Anemia     iron deficient  . GERD (gastroesophageal reflux disease)   . Benign positional vertigo   . Mitral valve prolapse   . Intraabdominal hemorrhage     lovenox therapy  . Bilateral renal cysts     Current Outpatient Prescriptions on File Prior to Visit  Medication Sig Dispense Refill  . Calcium Carbonate-Vitamin D (CALCIUM 600/VITAMIN D) 600-400 MG-UNIT per tablet Take 1 tablet by mouth daily.        Marland Kitchen SYNTHROID 75 MCG tablet May substitute generic.  One per day  30 tablet  5  . warfarin (COUMADIN) 6 MG tablet Take 6 mg by mouth daily.       No current facility-administered medications on file prior to visit.    Review of Systems Patient denies any headache.  Dizziness as outlined.   Persistent.   No sinus or allergy symptoms.  No chest pain or tightness.   No increased shortness of breath, cough or  congestion.   Breathing stable.  No nausea or vomiting.  No abdominal pain or cramping.  No bowel change, such as diarrhea, constipation, BRBPR or melana.  No urine change.        Objective:   Physical Exam  Filed Vitals:   07/14/13 1049  BP: 120/70  Pulse: 65  Temp: 98.5 F (36.9 C)   Blood pressure recheck:  128/68, pulse 76  (not orthostatic on exam)  79 year old female in no acute distress.   HEENT:  Nares- clear.  Oropharynx - without lesions. NECK:  Supple.  Nontender.  No audible bruit.  HEART:  Appears to be regular. LUNGS:  No crackles or wheezing audible.  Respirations even and unlabored.  RADIAL PULSE:  Equal bilaterally.    BREASTS:  No nipple discharge or nipple retraction present.  Could not appreciate any distinct nodules or axillary adenopathy.  ABDOMEN:  Soft, nontender.  Bowel sounds present and normal.  No audible abdominal bruit.  GU:  Not performed.    EXTREMITIES:  No increased edema present.  DP pulses palpable and equal bilaterally.      SKIN:  Bilateral lower extremity rash.       Assessment & Plan:  DERMATOLOGY.  Persistent leg rash.  Refer  back to Dr Adolphus Birchwood for evaluation and treatment.    HEALTH MAINTENANCE.  Physical today.   Colonoscopy 02/07/04 - single non bleeding colonic angioectasia, diverticulosis and internal hemorrhoids.  Pneumovax 09/30/10.  Mammogram 02/03/13 - Birads II.

## 2013-07-17 NOTE — Assessment & Plan Note (Signed)
Chronic/recurrent.  Worked up by Dr Cope.  Negative work up except for stable cysts.  Follow.    

## 2013-07-17 NOTE — Assessment & Plan Note (Signed)
Seeing Dr Gwen Pounds.  Off toprol now.  They have discussed pacemaker with her.  She declines at this time.  Follow.

## 2013-07-17 NOTE — Assessment & Plan Note (Signed)
On replacement.  Follow tsh.  

## 2013-07-17 NOTE — Assessment & Plan Note (Signed)
Has a history of iron deficient anemia.  Follow cbc and ferritin.   

## 2013-07-17 NOTE — Assessment & Plan Note (Signed)
Has seen ENT. MRI negative.  Apparently has required Epley maneuvers in the past.  Given the persistent dizziness, will refer back to ENT for evaluation.

## 2013-08-19 ENCOUNTER — Encounter: Payer: Self-pay | Admitting: Otolaryngology

## 2013-09-14 ENCOUNTER — Encounter: Payer: Self-pay | Admitting: Otolaryngology

## 2013-10-15 ENCOUNTER — Encounter: Payer: Self-pay | Admitting: Otolaryngology

## 2013-10-19 ENCOUNTER — Encounter: Payer: Self-pay | Admitting: Internal Medicine

## 2013-10-19 ENCOUNTER — Ambulatory Visit (INDEPENDENT_AMBULATORY_CARE_PROVIDER_SITE_OTHER): Payer: Medicare Other | Admitting: Internal Medicine

## 2013-10-19 VITALS — BP 110/70 | HR 75 | Temp 98.2°F | Ht 64.2 in | Wt 119.5 lb

## 2013-10-19 DIAGNOSIS — Z23 Encounter for immunization: Secondary | ICD-10-CM

## 2013-10-19 DIAGNOSIS — D649 Anemia, unspecified: Secondary | ICD-10-CM

## 2013-10-19 DIAGNOSIS — R42 Dizziness and giddiness: Secondary | ICD-10-CM

## 2013-10-19 DIAGNOSIS — K219 Gastro-esophageal reflux disease without esophagitis: Secondary | ICD-10-CM

## 2013-10-19 DIAGNOSIS — I4891 Unspecified atrial fibrillation: Secondary | ICD-10-CM

## 2013-10-19 DIAGNOSIS — E039 Hypothyroidism, unspecified: Secondary | ICD-10-CM

## 2013-10-19 DIAGNOSIS — R001 Bradycardia, unspecified: Secondary | ICD-10-CM

## 2013-10-19 DIAGNOSIS — R319 Hematuria, unspecified: Secondary | ICD-10-CM

## 2013-10-19 DIAGNOSIS — I498 Other specified cardiac arrhythmias: Secondary | ICD-10-CM

## 2013-10-19 MED ORDER — SYNTHROID 75 MCG PO TABS: ORAL_TABLET | ORAL | Status: DC

## 2013-10-19 NOTE — Progress Notes (Signed)
Pre-visit discussion using our clinic review tool. No additional management support is needed unless otherwise documented below in the visit note.  

## 2013-10-23 ENCOUNTER — Encounter: Payer: Self-pay | Admitting: Internal Medicine

## 2013-10-23 NOTE — Assessment & Plan Note (Signed)
Has seen ENT. MRI negative.  Has tried Epley maneuvers in the past.  Was referred to therapy.  She is doing her exercises.  Follow.  Desires no further intervention at this time.       

## 2013-10-23 NOTE — Assessment & Plan Note (Signed)
No symptoms.  Follow.   

## 2013-10-23 NOTE — Progress Notes (Signed)
Subjective:    Patient ID: Ana Griffith, female    DOB: [birth year], 79 y.o.   MRN: [id number]  HPI 79 year old female with past history of hypertension, atrial fib, GERD and hypothyroidism who comes in today for a scheduled follow up.  Seeing cardiology.  Having issues with continued dizziness.  Found to have intermittent episodes of bradycardia with increased heart rate.  She is now off her beta blocker.  Cardiology has discussed with her regarding pacemaker placement.  She is not ready to proceed with this, because she is not convinced that all her dizziness is related to her heart.  She has a history of "vertigo".  Has seen ENT and required Epley maneuvers previously.  No chest pain or tightness.  No nausea or vomiting.  No bowel change.  Dr Andee Poles referred her to therapy for her dizziness.  She is doing the exercises.  Saw dermatology.  Was told she had some issues related to sun exposure.  They are following.  Overall she feels things are stable.    Past Medical History  Diagnosis Date  . Chest pain, unspecified     cardiac cath 1999 - normal cors. Myoview 2009 EF 72. normal perfusion  . Atrial fibrillation     chronic  . DVT (deep venous thrombosis)     In setting of full anti-coagulation. IVC filter placed  . Hematuria   . Hypothyroidism   . Anemia     iron deficient  . GERD (gastroesophageal reflux disease)   . Benign positional vertigo   . Mitral valve prolapse   . Intraabdominal hemorrhage     lovenox therapy  . Bilateral renal cysts     Current Outpatient Prescriptions on File Prior to Visit  Medication Sig Dispense Refill  . Calcium Carbonate-Vitamin D (CALCIUM 600/VITAMIN D) 600-400 MG-UNIT per tablet Take 1 tablet by mouth daily.        Marland Kitchen warfarin (COUMADIN) 6 MG tablet Take 6 mg by mouth daily.       No current facility-administered medications on file prior to visit.    Review of Systems Patient denies any headache.  Dizziness as outlined.   Persistent.   No sinus or  allergy symptoms.  No chest pain or tightness.   No increased shortness of breath, cough or congestion.   Breathing stable.  No nausea or vomiting.  No abdominal pain or cramping.  No bowel change, such as diarrhea, constipation, BRBPR or melana.  No urine change.        Objective:   Physical Exam  Filed Vitals:   10/19/13 1114  BP: 110/70  Pulse: 75  Temp: 98.2 F (36.8 C)   Blood pressure recheck:  30/24  79 year old female in no acute distress.   HEENT:  Nares- clear.  Oropharynx - without lesions. NECK:  Supple.  Nontender.  No audible bruit.  HEART:  Appears to be regular. LUNGS:  No crackles or wheezing audible.  Respirations even and unlabored.  RADIAL PULSE:  Equal bilaterally.  ABDOMEN:  Soft, nontender.  Bowel sounds present and normal.  No audible abdominal bruit.    EXTREMITIES:  No increased edema present.  DP pulses palpable and equal bilaterally.       Assessment & Plan:  DERMATOLOGY.  Saw dermatology.  Obtain records.  They are following.     HEALTH MAINTENANCE.  Physical 07/14/13.    Colonoscopy 02/07/04 - single non bleeding colonic angioectasia, diverticulosis and internal hemorrhoids.  Pneumovax 09/30/10.  Mammogram  02/03/13 - Birads II.

## 2013-10-23 NOTE — Assessment & Plan Note (Signed)
Seeing Dr Gwen Pounds.  Off toprol now.  They have discussed pacemaker with her.  She declines at this time.  Follow.

## 2013-10-23 NOTE — Assessment & Plan Note (Signed)
Followed by Dr Gwen Pounds.  On coumadin.  Cardiology following pt/inr.  Having issues with dizziness.  Noted bradycardia and episodes of increased heart rate.  Cardiology has discussed possible pacemaker. She declines at this time.

## 2013-10-23 NOTE — Assessment & Plan Note (Signed)
On replacement.  Follow tsh.  

## 2013-10-23 NOTE — Assessment & Plan Note (Signed)
Has a history of iron deficient anemia.  Follow cbc and ferritin.   

## 2013-10-23 NOTE — Assessment & Plan Note (Signed)
Chronic/recurrent.  Worked up by Dr Cope.  Negative work up except for stable cysts.  Follow.    

## 2013-11-03 LAB — URINALYSIS, COMPLETE
Bacteria: NONE SEEN
Bilirubin,UR: NEGATIVE
Ketone: NEGATIVE
Leukocyte Esterase: NEGATIVE
Ph: 6 (ref 4.5–8.0)
RBC,UR: 268 /HPF (ref 0–5)
Squamous Epithelial: 1

## 2013-11-03 LAB — COMPREHENSIVE METABOLIC PANEL
Albumin: 4.1 g/dL (ref 3.4–5.0)
Anion Gap: 5 — ABNORMAL LOW (ref 7–16)
BUN: 27 mg/dL — ABNORMAL HIGH (ref 7–18)
Bilirubin,Total: 0.7 mg/dL (ref 0.2–1.0)
Creatinine: 1.03 mg/dL (ref 0.60–1.30)
EGFR (Non-African Amer.): 49 — ABNORMAL LOW
Osmolality: 278 (ref 275–301)
SGOT(AST): 28 U/L (ref 15–37)
Sodium: 136 mmol/L (ref 136–145)
Total Protein: 8 g/dL (ref 6.4–8.2)

## 2013-11-03 LAB — CBC WITH DIFFERENTIAL/PLATELET
Basophil %: 0.5 %
Eosinophil %: 0.7 %
HGB: 12.9 g/dL (ref 12.0–16.0)
Lymphocyte %: 9.8 %
MCH: 32.3 pg (ref 26.0–34.0)
MCHC: 34 g/dL (ref 32.0–36.0)
MCV: 95 fL (ref 80–100)
Monocyte %: 7.9 %
Neutrophil %: 81.1 %
Platelet: 161 10*3/uL (ref 150–440)
RBC: 4 10*6/uL (ref 3.80–5.20)
RDW: 13 % (ref 11.5–14.5)
WBC: 9.5 10*3/uL (ref 3.6–11.0)

## 2013-11-03 LAB — PROTIME-INR
INR: 1.4
Prothrombin Time: 17.4 secs — ABNORMAL HIGH (ref 11.5–14.7)

## 2013-11-04 ENCOUNTER — Inpatient Hospital Stay: Payer: Self-pay | Admitting: Surgery

## 2013-11-05 LAB — CBC WITH DIFFERENTIAL/PLATELET
Basophil #: 0 10*3/uL (ref 0.0–0.1)
Basophil %: 0.3 %
Eosinophil %: 0.1 %
HCT: 35.6 % (ref 35.0–47.0)
HGB: 12.1 g/dL (ref 12.0–16.0)
MCH: 32.6 pg (ref 26.0–34.0)
MCV: 96 fL (ref 80–100)
Monocyte #: 0.8 x10 3/mm (ref 0.2–0.9)
Monocyte %: 9.1 %
Platelet: 129 10*3/uL — ABNORMAL LOW (ref 150–440)
RDW: 13.3 % (ref 11.5–14.5)
WBC: 9.3 10*3/uL (ref 3.6–11.0)

## 2013-11-05 LAB — BASIC METABOLIC PANEL
Anion Gap: 4 — ABNORMAL LOW (ref 7–16)
Co2: 29 mmol/L (ref 21–32)
Creatinine: 0.98 mg/dL (ref 0.60–1.30)
EGFR (Non-African Amer.): 52 — ABNORMAL LOW
Glucose: 86 mg/dL (ref 65–99)
Osmolality: 276 (ref 275–301)
Potassium: 4.2 mmol/L (ref 3.5–5.1)
Sodium: 137 mmol/L (ref 136–145)

## 2013-11-06 LAB — CBC WITH DIFFERENTIAL/PLATELET
Basophil #: 0 10*3/uL (ref 0.0–0.1)
Eosinophil %: 2.4 %
HGB: 10.7 g/dL — ABNORMAL LOW (ref 12.0–16.0)
Lymphocyte #: 1 10*3/uL (ref 1.0–3.6)
Lymphocyte %: 11.9 %
MCH: 32.6 pg (ref 26.0–34.0)
MCHC: 34.2 g/dL (ref 32.0–36.0)
MCV: 95 fL (ref 80–100)
Monocyte #: 0.8 x10 3/mm (ref 0.2–0.9)
Monocyte %: 8.8 %
Neutrophil %: 76.4 %
Platelet: 125 10*3/uL — ABNORMAL LOW (ref 150–440)
RBC: 3.28 10*6/uL — ABNORMAL LOW (ref 3.80–5.20)
RDW: 13.1 % (ref 11.5–14.5)
WBC: 8.7 10*3/uL (ref 3.6–11.0)

## 2013-11-06 LAB — BASIC METABOLIC PANEL
Anion Gap: 3 — ABNORMAL LOW (ref 7–16)
Chloride: 104 mmol/L (ref 98–107)
EGFR (African American): >60
EGFR (Non-African Amer.): 56 — ABNORMAL LOW
Osmolality: 276 (ref 275–301)
Potassium: 3.8 mmol/L (ref 3.5–5.1)
Sodium: 137 mmol/L (ref 136–145)

## 2013-11-09 LAB — PROTIME-INR: INR: 1.1

## 2013-11-10 LAB — CBC WITH DIFFERENTIAL/PLATELET
Basophil #: 0 10*3/uL (ref 0.0–0.1)
Basophil %: 0.5 %
Eosinophil #: 0.3 10*3/uL (ref 0.0–0.7)
Eosinophil %: 4.2 %
Lymphocyte #: 0.9 10*3/uL — ABNORMAL LOW (ref 1.0–3.6)
MCH: 32.8 pg (ref 26.0–34.0)
MCHC: 34.5 g/dL (ref 32.0–36.0)
MCV: 95 fL (ref 80–100)
Monocyte #: 0.8 x10 3/mm (ref 0.2–0.9)
Monocyte %: 12.5 %
Neutrophil #: 4.3 10*3/uL (ref 1.4–6.5)
Neutrophil %: 68.7 %
RBC: 3.02 10*6/uL — ABNORMAL LOW (ref 3.80–5.20)

## 2013-11-10 LAB — BASIC METABOLIC PANEL
Anion Gap: 7 (ref 7–16)
BUN: 11 mg/dL (ref 7–18)
Chloride: 103 mmol/L (ref 98–107)
EGFR (Non-African Amer.): 59 — ABNORMAL LOW
Osmolality: 280 (ref 275–301)

## 2013-11-10 LAB — PROTIME-INR: INR: 1.2

## 2013-11-11 LAB — CBC WITH DIFFERENTIAL/PLATELET
Basophil %: 0.8 %
Eosinophil #: 0.2 10*3/uL (ref 0.0–0.7)
Eosinophil %: 3.4 %
HCT: 29.8 % — ABNORMAL LOW (ref 35.0–47.0)
Lymphocyte %: 11 %
MCV: 94 fL (ref 80–100)
Monocyte %: 11.7 %
Platelet: 184 10*3/uL (ref 150–440)
RBC: 3.17 10*6/uL — ABNORMAL LOW (ref 3.80–5.20)
RDW: 12.7 % (ref 11.5–14.5)
WBC: 7 10*3/uL (ref 3.6–11.0)

## 2013-11-11 LAB — PROTIME-INR
INR: 1.2
Prothrombin Time: 15.6 secs — ABNORMAL HIGH (ref 11.5–14.7)

## 2013-11-15 DIAGNOSIS — K562 Volvulus: Secondary | ICD-10-CM | POA: Insufficient documentation

## 2013-11-15 DIAGNOSIS — N83209 Unspecified ovarian cyst, unspecified side: Secondary | ICD-10-CM | POA: Insufficient documentation

## 2014-02-20 ENCOUNTER — Encounter (INDEPENDENT_AMBULATORY_CARE_PROVIDER_SITE_OTHER): Payer: Self-pay

## 2014-02-20 ENCOUNTER — Ambulatory Visit (INDEPENDENT_AMBULATORY_CARE_PROVIDER_SITE_OTHER): Payer: 59 | Admitting: Internal Medicine

## 2014-02-20 ENCOUNTER — Encounter: Payer: Self-pay | Admitting: Internal Medicine

## 2014-02-20 VITALS — BP 112/60 | HR 70 | Temp 98.2°F | Resp 16 | Ht 64.2 in | Wt 124.0 lb

## 2014-02-20 DIAGNOSIS — D649 Anemia, unspecified: Secondary | ICD-10-CM

## 2014-02-20 DIAGNOSIS — N83209 Unspecified ovarian cyst, unspecified side: Secondary | ICD-10-CM

## 2014-02-20 DIAGNOSIS — E039 Hypothyroidism, unspecified: Secondary | ICD-10-CM

## 2014-02-20 DIAGNOSIS — R42 Dizziness and giddiness: Secondary | ICD-10-CM

## 2014-02-20 DIAGNOSIS — Z1239 Encounter for other screening for malignant neoplasm of breast: Secondary | ICD-10-CM

## 2014-02-20 DIAGNOSIS — I498 Other specified cardiac arrhythmias: Secondary | ICD-10-CM

## 2014-02-20 DIAGNOSIS — Z13220 Encounter for screening for lipoid disorders: Secondary | ICD-10-CM

## 2014-02-20 DIAGNOSIS — R319 Hematuria, unspecified: Secondary | ICD-10-CM

## 2014-02-20 DIAGNOSIS — K562 Volvulus: Secondary | ICD-10-CM

## 2014-02-20 DIAGNOSIS — I4891 Unspecified atrial fibrillation: Secondary | ICD-10-CM

## 2014-02-20 DIAGNOSIS — K219 Gastro-esophageal reflux disease without esophagitis: Secondary | ICD-10-CM

## 2014-02-20 DIAGNOSIS — R001 Bradycardia, unspecified: Secondary | ICD-10-CM

## 2014-02-20 NOTE — Assessment & Plan Note (Signed)
Has seen ENT. MRI negative.  Has tried Epley maneuvers in the past.  Was referred to therapy.  She is doing her exercises.  Follow.  Desires no further intervention at this time.

## 2014-02-20 NOTE — Progress Notes (Signed)
Subjective:    Patient ID: Ana Griffith, female    DOB: Sep 28, 1924, 80 y.o.   MRN: [id number]  HPI 80 year old female with past history of hypertension, atrial fib, GERD and hypothyroidism who comes in today for a scheduled follow up.  Seeing cardiology.  Has had issues with continued intermittent dizziness.  When still, not an issue. Found to have intermittent episodes of bradycardia with increased heart rate.  She is now off her beta blocker.  Cardiology has discussed with her regarding pacemaker placement.  She is not ready to proceed with this,   She has a history of "vertigo".  Has seen ENT and required Epley maneuvers previously.  No chest pain or tightness.  No nausea or vomiting.  No bowel change.  Recently had cecal volvulus.  S/p colon surgery.  Is doing well.  Bowels moving.       Past Medical History  Diagnosis Date  . Chest pain, unspecified     cardiac cath 1999 - normal cors. Myoview 2009 EF 72. normal perfusion  . Atrial fibrillation     chronic  . DVT (deep venous thrombosis)     In setting of full anti-coagulation. IVC filter placed  . Hematuria   . Hypothyroidism   . Anemia     iron deficient  . GERD (gastroesophageal reflux disease)   . Benign positional vertigo   . Mitral valve prolapse   . Intraabdominal hemorrhage     lovenox therapy  . Bilateral renal cysts     Current Outpatient Prescriptions on File Prior to Visit  Medication Sig Dispense Refill  . Calcium Carbonate-Vitamin D (CALCIUM 600/VITAMIN D) 600-400 MG-UNIT per tablet Take 1 tablet by mouth daily.        Marland Kitchen SYNTHROID 75 MCG tablet May substitute generic.  One per day  30 tablet  5  . warfarin (COUMADIN) 6 MG tablet Take 6 mg by mouth daily.       No current facility-administered medications on file prior to visit.    Review of Systems Patient denies any headache.  Dizziness as outlined.   Persistent.  Occurs with bending and stooping.   No sinus or allergy symptoms.  No chest pain or tightness.   No  increased shortness of breath, cough or congestion.   Breathing stable.  No nausea or vomiting.  No significant abdominal pain or cramping.  Is s/p colon surgery.  Doing well.   No bowel change, such as diarrhea, constipation, BRBPR or melana.  No urine change.  Overall feels things are stable.       Objective:   Physical Exam  Filed Vitals:   02/20/14 1113  BP: 112/60  Pulse: 70  Temp: 98.2 F (36.8 C)  Resp: 16   Blood pressure recheck:  142/82, pulse 29  80 year old female in no acute distress.   HEENT:  Nares- clear.  Oropharynx - without lesions. NECK:  Supple.  Nontender.  No audible bruit.  HEART:  Rate controlled.  LUNGS:  No crackles or wheezing audible.  Respirations even and unlabored.  RADIAL PULSE:  Equal bilaterally.  ABDOMEN:  Soft, nontender.  Bowel sounds present and normal.  No audible abdominal bruit.   Well healed mid line incision scar.  EXTREMITIES:  No increased edema present.  DP pulses palpable and equal bilaterally.       Assessment & Plan:  DERMATOLOGY.  Saw dermatology.  They are following.     HEALTH MAINTENANCE.  Physical 07/14/13.  Colonoscopy 02/07/04 - single non bleeding colonic angioectasia, diverticulosis and internal hemorrhoids.  Pneumovax 09/30/10.  Mammogram 02/03/13 - Birads II.   Schedule follow up mammogram.

## 2014-02-20 NOTE — Assessment & Plan Note (Signed)
Discussed with pt.  Will schedule f/u pelvic ultrasound to better evaluate and for f/u.

## 2014-02-20 NOTE — Assessment & Plan Note (Signed)
On replacement.  Follow tsh.  

## 2014-02-20 NOTE — Assessment & Plan Note (Signed)
Chronic/recurrent.  Worked up by Dr Achilles Dunkope.  Negative work up except for stable cysts.  Follow.

## 2014-02-20 NOTE — Assessment & Plan Note (Signed)
Has a history of iron deficient anemia.  Follow cbc and ferritin.   

## 2014-02-20 NOTE — Assessment & Plan Note (Signed)
Seeing Dr Gwen PoundsKowalski.  Off toprol now.  They have discussed pacemaker with her.  She has declined.

## 2014-02-20 NOTE — Assessment & Plan Note (Signed)
Doing well s/p surgery.  Follow.  

## 2014-02-20 NOTE — Assessment & Plan Note (Signed)
No symptoms.  Follow.   

## 2014-02-20 NOTE — Assessment & Plan Note (Signed)
Followed by Dr Gwen PoundsKowalski.  On coumadin.  Cardiology following pt/inr.  Having issues with dizziness.  Noted bradycardia and episodes of increased heart rate.  Cardiology has discussed possible pacemaker. She has declined.

## 2014-02-20 NOTE — Progress Notes (Signed)
Pre visit review using our clinic review tool, if applicable. No additional management support is needed unless otherwise documented below in the visit note. 

## 2014-02-22 ENCOUNTER — Encounter: Payer: Self-pay | Admitting: Emergency Medicine

## 2014-02-24 ENCOUNTER — Ambulatory Visit: Payer: Self-pay | Admitting: Internal Medicine

## 2014-03-06 ENCOUNTER — Ambulatory Visit: Payer: Self-pay | Admitting: Internal Medicine

## 2014-03-06 LAB — HM MAMMOGRAPHY: HM MAMMO: NEGATIVE

## 2014-03-10 ENCOUNTER — Encounter: Payer: Self-pay | Admitting: Internal Medicine

## 2014-03-31 ENCOUNTER — Encounter: Payer: Self-pay | Admitting: Internal Medicine

## 2014-04-10 ENCOUNTER — Encounter: Payer: Self-pay | Admitting: Internal Medicine

## 2014-04-17 ENCOUNTER — Ambulatory Visit: Payer: Self-pay | Admitting: Family Medicine

## 2014-04-19 ENCOUNTER — Telehealth: Payer: Self-pay | Admitting: Internal Medicine

## 2014-04-19 NOTE — Telephone Encounter (Signed)
Records requested. Taking prednisone and hydrocodone every 4 hours. Pain is improved but has not resolved. No follow up was scheduled. She is afraid she will run out of her pain medication on Saturday and still be needing it. Pt accepted appt with Raquel for tomorrow.

## 2014-04-19 NOTE — Telephone Encounter (Signed)
Please advise appt with you, or offer appt with Raquel?

## 2014-04-19 NOTE — Telephone Encounter (Signed)
Need records to review.  Did they set her up to f/u with anyone, i.e., ortho, etc.  If she is having increased pain and needing more pain medication and no f/u arranged, etc - then yes she will need to be reevaluated.  Thanks.

## 2014-04-19 NOTE — Telephone Encounter (Signed)
Pt called with message:  [facility] with severe pain in back on right side and had a CT scan and it did not show a kidney stone but the synopsis of lower spine they thought was a pinched nerve and put her on prednisone and a strong pain medication, hydrocodone.  Pt did nto wake up during the night last night to take a pain pill and this morning she is having bad pain.  Asking if the medicine runs out she cannot get a refill.  She was told if it runs out and she is still in pain she will have to go to the ER.  Pt asking what Dr. Lorin PicketScott suggests she do.  Can Dr. Lorin PicketScott prescribe her more for the weekend to make sure she is not stuck in pain.  She states she cannot go to the ER.

## 2014-04-20 ENCOUNTER — Encounter: Payer: Self-pay | Admitting: Adult Health

## 2014-04-20 ENCOUNTER — Ambulatory Visit (INDEPENDENT_AMBULATORY_CARE_PROVIDER_SITE_OTHER): Payer: 59 | Admitting: Adult Health

## 2014-04-20 VITALS — BP 132/74 | HR 71 | Temp 97.9°F | Resp 14 | Wt 124.8 lb

## 2014-04-20 DIAGNOSIS — IMO0002 Reserved for concepts with insufficient information to code with codable children: Secondary | ICD-10-CM

## 2014-04-20 MED ORDER — HYDROCODONE-ACETAMINOPHEN 5-325 MG PO TABS: 1.0000 | ORAL_TABLET | Freq: Four times a day (QID) | ORAL | Status: AC | PRN

## 2014-04-20 NOTE — Patient Instructions (Signed)
   Continue your prednisone taper as directed. Take with food.  Hydrocodone 1 tablet every 6 hours as needed for pain. This will cause sedation. Please use extra caution not to fall.  Apply ice alternating with heat to the affected areas for 15 min at a time. Do this approximately 4-5 times daily.  Use a firm pillow between your knees when you lie on your side or under your knees when you lie on your back.  I am referring you to Dr. Noel Geroldohen for evaluation of your low back issues and pain.  Please call with any questions or concerns

## 2014-04-20 NOTE — Progress Notes (Signed)
Subjective:    Patient ID: Ana Griffith, female    DOB: 07/30/1924, 80 y.o.   MRN: [id number]  HPI Pt is a pleasant 80 y/o female who presents to clinic [facility] with c/o left sided low back pain that radiated to her groin and left flank. She is s/p CT scan which showed no evidence of nephrolithiasis. She does have a ss81mall hiatal hernia, lumbar spondylosis and degenerative disc dz L3-L4, L4-L5, L5-S1, right foraminal stenosis at L4-L5. Pt was prescribed Norco every 4-6 hours for pain #10. She presents today with concerns that she is about to run out of her pain medication and she is still in pain. Pt is also on a prednisone taper which she should complete on Saturday.  Pt was reported that the Norco was very strong - "you cannot function on this medication". Her sister has driven her to clinic today. Pt wants to be referred to ortho for her back problems.  Past Medical History  Diagnosis Date  . Chest pain, unspecified     cardiac cath 1999 - normal cors. Myoview 2009 EF 72. normal perfusion  . Atrial fibrillation     chronic  . DVT (deep venous thrombosis)     In setting of full anti-coagulation. IVC filter placed  . Hematuria   . Hypothyroidism   . Anemia     iron deficient  . GERD (gastroesophageal reflux disease)   . Benign positional vertigo   . Mitral valve prolapse   . Intraabdominal hemorrhage     lovenox therapy  . Bilateral renal cysts     Current Outpatient Prescriptions on File Prior to Visit  Medication Sig Dispense Refill  . Calcium Carbonate-Vitamin D (CALCIUM 600/VITAMIN D) 600-400 MG-UNIT per tablet Take 1 tablet by mouth daily.        Marland Kitchen. SYNTHROID 75 MCG tablet May substitute generic.  One per day  30 tablet  5  . warfarin (COUMADIN) 6 MG tablet Take 6 mg by mouth daily.       No current facility-administered medications on file prior to visit.    Review of Systems  Constitutional: Negative.   HENT: Negative.   Eyes: Negative.     Respiratory: Negative.   Cardiovascular: Negative.   Gastrointestinal: Negative.   Endocrine: Negative.   Genitourinary: Negative.   Musculoskeletal: Positive for back pain. Negative for gait problem.  Skin: Negative.   Allergic/Immunologic: Negative.   Neurological: Negative.  Negative for weakness and numbness.  Hematological: Negative.   Psychiatric/Behavioral: Negative.   All other systems reviewed and are negative.      Objective:   Physical Exam  Constitutional: She is oriented to person, place, and time. No distress.  HENT:  Head: Normocephalic and atraumatic.  Eyes: Conjunctivae and EOM are normal.  Neck: Normal range of motion. Neck supple.  Cardiovascular: Normal rate and regular rhythm.   Pulmonary/Chest: Effort normal. No respiratory distress.  Musculoskeletal: Normal range of motion.  Neurological: She is alert and oriented to person, place, and time. She has normal reflexes. Coordination normal.  Skin: Skin is warm and dry.  Psychiatric: She has a normal mood and affect. Her behavior is normal. Judgment and thought content normal.       Assessment & Plan:   1. Degenerative disk disease Continue prednisone taper. Offered to change her pain medication to other less sedating option but pt wanted to continue with Norco. I provided her with prescription for Norco 1 tablet every 6  hours as needed for pain. She is to apply ice to the affected area for 15 min and do this 4-5 times daily. She may try moist heat to relief. I am referring her to Dr. Noel [place]ohen in [place] per her request. She has seen him in the past and felt comfortable with him. RTC if no improvement in symptoms within 2 weeks or sooner if necessary. Additional instructions provided. Please refer to pt instruction section. - Ambulatory referral to Orthopedic Surgery

## 2014-04-20 NOTE — Progress Notes (Signed)
Pre visit review using our clinic review tool, if applicable. No additional management support is needed unless otherwise documented below in the visit note. 

## 2014-04-25 ENCOUNTER — Telehealth: Payer: Self-pay | Admitting: Internal Medicine

## 2014-04-25 NOTE — Telephone Encounter (Signed)
Pt called to check status of referral to Dr. Noel Geroldohen.

## 2014-05-07 NOTE — Telephone Encounter (Signed)
04/27/14 Received fax from [facility] specialist they have scheduled appointment with patient for 05/11/14 at 2:00 in the [place] office. It states that the patient has been notified. This was documented in referral notes.

## 2014-07-13 ENCOUNTER — Other Ambulatory Visit: Payer: Self-pay | Admitting: *Deleted

## 2014-07-13 MED ORDER — SYNTHROID 75 MCG PO TABS: ORAL_TABLET | ORAL | Status: DC

## 2014-07-13 NOTE — Telephone Encounter (Signed)
Lab appt 07/19/14

## 2014-07-19 ENCOUNTER — Other Ambulatory Visit (INDEPENDENT_AMBULATORY_CARE_PROVIDER_SITE_OTHER): Payer: Medicare Other

## 2014-07-19 DIAGNOSIS — E039 Hypothyroidism, unspecified: Secondary | ICD-10-CM

## 2014-07-19 DIAGNOSIS — Z13220 Encounter for screening for lipoid disorders: Secondary | ICD-10-CM

## 2014-07-19 DIAGNOSIS — K562 Volvulus: Secondary | ICD-10-CM

## 2014-07-19 DIAGNOSIS — D649 Anemia, unspecified: Secondary | ICD-10-CM

## 2014-07-19 DIAGNOSIS — R319 Hematuria, unspecified: Secondary | ICD-10-CM

## 2014-07-19 LAB — URINALYSIS, ROUTINE W REFLEX MICROSCOPIC
Bilirubin Urine: NEGATIVE
Ketones, ur: NEGATIVE
NITRITE: NEGATIVE
PH: 5.5 (ref 5.0–8.0)
SPECIFIC GRAVITY, URINE: 1.015 (ref 1.000–1.030)
TOTAL PROTEIN, URINE-UPE24: NEGATIVE
Urine Glucose: NEGATIVE
Urobilinogen, UA: 0.2 (ref 0.0–1.0)

## 2014-07-19 LAB — CBC WITH DIFFERENTIAL/PLATELET
BASOS PCT: 0.5 % (ref 0.0–3.0)
Basophils Absolute: 0 10*3/uL (ref 0.0–0.1)
EOS PCT: 3.4 % (ref 0.0–5.0)
Eosinophils Absolute: 0.2 10*3/uL (ref 0.0–0.7)
HEMATOCRIT: 38.7 % (ref 36.0–46.0)
Hemoglobin: 12.7 g/dL (ref 12.0–15.0)
LYMPHS ABS: 1.6 10*3/uL (ref 0.7–4.0)
Lymphocytes Relative: 26.2 % (ref 12.0–46.0)
MCHC: 32.7 g/dL (ref 30.0–36.0)
MCV: 95.9 fl (ref 78.0–100.0)
MONO ABS: 0.6 10*3/uL (ref 0.1–1.0)
MONOS PCT: 10.3 % (ref 3.0–12.0)
Neutro Abs: 3.7 10*3/uL (ref 1.4–7.7)
Neutrophils Relative %: 59.6 % (ref 43.0–77.0)
Platelets: 184 10*3/uL (ref 150.0–400.0)
RBC: 4.03 Mil/uL (ref 3.87–5.11)
RDW: 14.1 % (ref 11.5–15.5)
WBC: 6.2 10*3/uL (ref 4.0–10.5)

## 2014-07-19 LAB — COMPREHENSIVE METABOLIC PANEL
ALK PHOS: 68 U/L (ref 39–117)
ALT: 13 U/L (ref 0–35)
AST: 25 U/L (ref 0–37)
Albumin: 3.8 g/dL (ref 3.5–5.2)
BUN: 18 mg/dL (ref 6–23)
CO2: 28 meq/L (ref 19–32)
Calcium: 8.6 mg/dL (ref 8.4–10.5)
Chloride: 107 mEq/L (ref 96–112)
Creatinine, Ser: 1.1 mg/dL (ref 0.4–1.2)
GFR: 50.32 mL/min — AB (ref >60.00)
GLUCOSE: 88 mg/dL (ref 70–99)
Potassium: 4.1 mEq/L (ref 3.5–5.1)
SODIUM: 141 meq/L (ref 135–145)
TOTAL PROTEIN: 7 g/dL (ref 6.0–8.3)
Total Bilirubin: 0.6 mg/dL (ref 0.2–1.2)

## 2014-07-19 LAB — LIPID PANEL
CHOLESTEROL: 149 mg/dL (ref 0–200)
HDL: 42.4 mg/dL (ref >39.00)
LDL Cholesterol: 82 mg/dL (ref 0–99)
NONHDL: 106.6
Total CHOL/HDL Ratio: 4
Triglycerides: 123 mg/dL (ref 0.0–149.0)
VLDL: 24.6 mg/dL (ref 0.0–40.0)

## 2014-07-19 LAB — TSH: TSH: 3.16 u[IU]/mL (ref 0.35–4.50)

## 2014-07-20 NOTE — Addendum Note (Signed)
Addended by: Montine CircleMALDONADO, Katilynn Sinkler D on: 07/20/2014 07:59 AM   Modules accepted: Orders

## 2014-07-23 LAB — CULTURE, URINE COMPREHENSIVE

## 2014-07-24 ENCOUNTER — Encounter: Payer: Self-pay | Admitting: Internal Medicine

## 2014-07-24 ENCOUNTER — Ambulatory Visit (INDEPENDENT_AMBULATORY_CARE_PROVIDER_SITE_OTHER): Payer: Medicare Other | Admitting: Internal Medicine

## 2014-07-24 VITALS — BP 110/70 | HR 73 | Temp 97.9°F | Ht 64.0 in | Wt 130.5 lb

## 2014-07-24 DIAGNOSIS — K219 Gastro-esophageal reflux disease without esophagitis: Secondary | ICD-10-CM

## 2014-07-24 DIAGNOSIS — R319 Hematuria, unspecified: Secondary | ICD-10-CM

## 2014-07-24 DIAGNOSIS — I482 Chronic atrial fibrillation, unspecified: Secondary | ICD-10-CM

## 2014-07-24 DIAGNOSIS — I4891 Unspecified atrial fibrillation: Secondary | ICD-10-CM

## 2014-07-24 DIAGNOSIS — K562 Volvulus: Secondary | ICD-10-CM

## 2014-07-24 DIAGNOSIS — D649 Anemia, unspecified: Secondary | ICD-10-CM

## 2014-07-24 DIAGNOSIS — N83209 Unspecified ovarian cyst, unspecified side: Secondary | ICD-10-CM

## 2014-07-24 DIAGNOSIS — E039 Hypothyroidism, unspecified: Secondary | ICD-10-CM

## 2014-07-24 DIAGNOSIS — R42 Dizziness and giddiness: Secondary | ICD-10-CM

## 2014-07-24 MED ORDER — AMOXICILLIN 500 MG PO CAPS: 500.0000 mg | ORAL_CAPSULE | Freq: Two times a day (BID) | ORAL | Status: AC

## 2014-07-24 NOTE — Progress Notes (Signed)
Pre visit review using our clinic review tool, if applicable. No additional management support is needed unless otherwise documented below in the visit note. 

## 2014-07-25 ENCOUNTER — Encounter: Payer: Self-pay | Admitting: Internal Medicine

## 2014-07-25 NOTE — Assessment & Plan Note (Signed)
Has a history of iron deficient anemia.  Follow cbc and ferritin.  07/19/14 hgb - 12.7.

## 2014-07-25 NOTE — Assessment & Plan Note (Signed)
Followed by Dr Gwen PoundsKowalski.  On coumadin.  Cardiology following pt/inr.  Cardiology has discussed possible pacemaker. She has declined.  Dizziness better.  Currently stable from a cardiac standpoint.

## 2014-07-25 NOTE — Assessment & Plan Note (Signed)
Doing well s/p surgery.  Follow.  

## 2014-07-25 NOTE — Progress Notes (Signed)
Subjective:    Patient ID: Ana Griffith, female    DOB: 09/20/1924, 80 y.o.   MRN: [id number]  HPI 80 year old female with past history of hypertension, atrial fib, GERD and hypothyroidism who comes in today to follow up on these issues as well as for a complete physical exam.  Seeing cardiology.  Found to have intermittent episodes of bradycardia with increased heart rate.  She is now off her beta blocker.  Cardiology has discussed with her regarding pacemaker placement.  She is not ready to proceed with this,   She has a history of "vertigo".  Has seen ENT and required Epley maneuvers previously.  State dizziness and lightheadedness - not an issue for her now.  Much better.  No chest pain or tightness.  No nausea or vomiting.  No bowel change.  Recently had cecal volvulus.  S/p colon surgery.  Is doing well.  Bowels moving.   She does report that 07/08/14, she noticed some increased discomfort in her upper abdomen.  Some nausea.  On vomiting.  She started taking zantac and the symptoms resolved.  Not taking zantac now.  No pain.  Eating and drinking well.  Recently found to have a uti.  Increased blood (rbc's) in her urine.  Has a history of hematuria.  Has been followed by urology.  Not seen lately.      Past Medical History  Diagnosis Date  . Chest pain, unspecified     cardiac cath 1999 - normal cors. Myoview 2009 EF 72. normal perfusion  . Atrial fibrillation     chronic  . DVT (deep venous thrombosis)     In setting of full anti-coagulation. IVC filter placed  . Hematuria   . Hypothyroidism   . Anemia     iron deficient  . GERD (gastroesophageal reflux disease)   . Benign positional vertigo   . Mitral valve prolapse   . Intraabdominal hemorrhage     lovenox therapy  . Bilateral renal cysts     Current Outpatient Prescriptions on File Prior to Visit  Medication Sig Dispense Refill  . Calcium Carbonate-Vitamin D (CALCIUM 600/VITAMIN D) 600-400 MG-UNIT per tablet Take 1 tablet by mouth  daily.        Marland Kitchen. HYDROcodone-acetaminophen (NORCO/VICODIN) 5-325 MG per tablet Take 1 tablet by mouth every 6 (six) hours as needed for moderate pain.  45 tablet  0  . predniSONE (DELTASONE) 10 MG tablet Take 10 mg by mouth daily with breakfast.      . SYNTHROID 75 MCG tablet May substitute generic.  One per day  30 tablet  0  . warfarin (COUMADIN) 6 MG tablet Take 6 mg by mouth daily.       No current facility-administered medications on file prior to visit.    Review of Systems Patient denies any headache.  Dizziness better.   No sinus or allergy symptoms.  No chest pain or tightness.   No increased shortness of breath, cough or congestion.   Breathing stable.  No nausea or vomiting.  No abdominal pain or cramping now.  Previous pain resolved.  Off zantac.   Is s/p colon surgery.  Doing well.   No bowel change, such as diarrhea, constipation, BRBPR or melana.  Has not noticed any blood in her urine.  Overall feels things are stable.       Objective:   Physical Exam  Filed Vitals:   07/24/14 1035  BP: 110/70  Pulse: 73  Temp: 97.9 F (  36.6 C)   Blood pressure recheck:  13/52  80 year old female in no acute distress.   HEENT:  Nares- clear.  Oropharynx - without lesions. NECK:  Supple.  Nontender.  No audible bruit.  HEART:  Appears to be regular. LUNGS:  No crackles or wheezing audible.  Respirations even and unlabored.  RADIAL PULSE:  Equal bilaterally.    BREASTS:  No nipple discharge or nipple retraction present.  Could not appreciate any distinct nodules or axillary adenopathy.  ABDOMEN:  Soft, nontender.  Bowel sounds present and normal.  No audible abdominal bruit.  GU:  Not performed.     EXTREMITIES:  No increased edema present.  DP pulses palpable and equal bilaterally.           Assessment & Plan:  DERMATOLOGY.  Saw dermatology.  They are following.     HEALTH MAINTENANCE.  Physical today.    Colonoscopy 02/07/04 - single non bleeding colonic angioectasia,  diverticulosis and internal hemorrhoids.  Mammogram 03/08/14 - Birads I.    I spent 25 minutes with the patient and more than 50% of the time was spent in consultation regarding the above.

## 2014-07-25 NOTE — Assessment & Plan Note (Addendum)
Recent urinalysis revealed TNTC rbc's.  Culture positive for enterococcus and proteus.  Treat with amoxicillin as directed.  Will recheck urinalysis.  Refer back to urology for f/u of hematuria.  Pt wants to stay in town.

## 2014-07-25 NOTE — Assessment & Plan Note (Signed)
Not an issue for her now.    

## 2014-07-25 NOTE — Assessment & Plan Note (Signed)
On replacement.  Follow tsh.  

## 2014-07-25 NOTE — Assessment & Plan Note (Signed)
No symptoms now.  Had the episode of epigastric/upper abdominal pain as outlined.  Took zantac.  Resolved.  Discussed further evaluation.  She wants to monitor for now.  Follow.

## 2014-07-25 NOTE — Assessment & Plan Note (Signed)
Follow up pelvic ultrasound - negative.

## 2014-07-31 ENCOUNTER — Other Ambulatory Visit: Payer: Self-pay | Admitting: Internal Medicine

## 2014-07-31 ENCOUNTER — Other Ambulatory Visit (INDEPENDENT_AMBULATORY_CARE_PROVIDER_SITE_OTHER): Payer: Medicare Other

## 2014-07-31 DIAGNOSIS — R319 Hematuria, unspecified: Secondary | ICD-10-CM

## 2014-07-31 LAB — URINALYSIS, ROUTINE W REFLEX MICROSCOPIC
BILIRUBIN URINE: NEGATIVE
Ketones, ur: NEGATIVE
Leukocytes, UA: NEGATIVE
Nitrite: NEGATIVE
PH: 5.5 (ref 5.0–8.0)
Specific Gravity, Urine: 1.01 (ref 1.000–1.030)
TOTAL PROTEIN, URINE-UPE24: NEGATIVE
URINE GLUCOSE: NEGATIVE
Urobilinogen, UA: 0.2 (ref 0.0–1.0)
WBC, UA: NONE SEEN (ref 0–?)

## 2014-07-31 NOTE — Addendum Note (Signed)
Addended by: Montine CircleMALDONADO, Jenney Brester D on: 07/31/2014 02:12 PM   Modules accepted: Orders

## 2014-07-31 NOTE — Progress Notes (Signed)
Order placed for add on urine culture ?

## 2014-08-02 LAB — CULTURE, URINE COMPREHENSIVE
COLONY COUNT: NO GROWTH
ORGANISM ID, BACTERIA: NO GROWTH

## 2014-08-03 ENCOUNTER — Encounter: Payer: Self-pay | Admitting: *Deleted

## 2014-08-07 ENCOUNTER — Other Ambulatory Visit: Payer: Medicare Other

## 2014-08-14 ENCOUNTER — Other Ambulatory Visit: Payer: Self-pay | Admitting: *Deleted

## 2014-08-14 MED ORDER — SYNTHROID 75 MCG PO TABS: ORAL_TABLET | ORAL | Status: AC

## 2014-08-31 ENCOUNTER — Emergency Department: Payer: Self-pay | Admitting: Internal Medicine

## 2014-08-31 LAB — URINALYSIS, COMPLETE
Bacteria: NONE SEEN
Bilirubin,UR: NEGATIVE
Glucose,UR: NEGATIVE mg/dL (ref 0–75)
KETONE: NEGATIVE
Leukocyte Esterase: NEGATIVE
Nitrite: NEGATIVE
Ph: 6 (ref 4.5–8.0)
Protein: 30
Specific Gravity: 1.015 (ref 1.003–1.030)
Squamous Epithelial: <1

## 2014-08-31 LAB — COMPREHENSIVE METABOLIC PANEL
ALK PHOS: 86 U/L
ANION GAP: 8 (ref 7–16)
Albumin: 3.8 g/dL (ref 3.4–5.0)
BUN: 18 mg/dL (ref 7–18)
Bilirubin,Total: 0.5 mg/dL (ref 0.2–1.0)
Calcium, Total: 8.7 mg/dL (ref 8.5–10.1)
Chloride: 106 mmol/L (ref 98–107)
Co2: 24 mmol/L (ref 21–32)
Creatinine: 1.07 mg/dL (ref 0.60–1.30)
GFR CALC AF AMER: 54 — AB
GFR CALC NON AF AMER: 46 — AB
GLUCOSE: 120 mg/dL — AB (ref 65–99)
OSMOLALITY: 279 (ref 275–301)
POTASSIUM: 4 mmol/L (ref 3.5–5.1)
SGOT(AST): 30 U/L (ref 15–37)
SGPT (ALT): 18 U/L
Sodium: 138 mmol/L (ref 136–145)
Total Protein: 8.1 g/dL (ref 6.4–8.2)

## 2014-08-31 LAB — CBC
HCT: 41.8 % (ref 35.0–47.0)
HGB: 13.4 g/dL (ref 12.0–16.0)
MCH: 30.9 pg (ref 26.0–34.0)
MCHC: 32.1 g/dL (ref 32.0–36.0)
MCV: 96 fL (ref 80–100)
Platelet: 196 10*3/uL (ref 150–440)
RBC: 4.33 10*6/uL (ref 3.80–5.20)
RDW: 13.3 % (ref 11.5–14.5)
WBC: 9.4 10*3/uL (ref 3.6–11.0)

## 2014-08-31 LAB — TROPONIN I

## 2014-08-31 LAB — LIPASE, BLOOD: LIPASE: 112 U/L (ref 73–393)

## 2014-09-01 ENCOUNTER — Telehealth: Payer: Self-pay | Admitting: Internal Medicine

## 2014-09-01 NOTE — Telephone Encounter (Signed)
Pt came by and stated she was seen in the hospital and dropped off some forms for Dr. Lorin Picket to look at. Placed in box

## 2014-09-01 NOTE — Telephone Encounter (Signed)
Will review records

## 2014-09-01 NOTE — Telephone Encounter (Signed)
Records placed in green folder 

## 2014-09-05 ENCOUNTER — Telehealth: Payer: Self-pay | Admitting: Internal Medicine

## 2014-09-05 NOTE — Telephone Encounter (Signed)
Hospital records requested 

## 2014-09-05 NOTE — Telephone Encounter (Signed)
Pt wrote me a message regarding a recent ER visit.  Need ER records from 9//17/18.  Also, we had referred her to urology (saw Michiel Cowboy) - for hematuria.  Need office notes from that visit.  Need to make sure pt is dong ok now.  (was having increased abdominal pain).

## 2014-09-06 ENCOUNTER — Ambulatory Visit (INDEPENDENT_AMBULATORY_CARE_PROVIDER_SITE_OTHER): Payer: Medicare Other

## 2014-09-06 ENCOUNTER — Other Ambulatory Visit: Payer: Self-pay | Admitting: Internal Medicine

## 2014-09-06 DIAGNOSIS — Z23 Encounter for immunization: Secondary | ICD-10-CM

## 2014-09-06 NOTE — Telephone Encounter (Signed)
Duplicate.  See attached.   

## 2014-09-06 NOTE — Telephone Encounter (Signed)
Hospital records placed in your green folder 

## 2014-09-06 NOTE — Telephone Encounter (Signed)
Urology noted received & placed in green folder for review

## 2014-09-06 NOTE — Telephone Encounter (Signed)
Reviewed records.  Need to know how she is doing now.  Bowels moving.  If persistent pain and nausea, etc, may need referral to GI.  Does she feel needs f/u appt with me now?

## 2014-09-06 NOTE — Telephone Encounter (Signed)
Pt states that bowels were loose until yesterday. Stool became firm yesterday & still is today. Doesn't feel the need to be seen. Reports no pain or nausea.

## 2014-10-03 ENCOUNTER — Encounter (HOSPITAL_COMMUNITY): Payer: Self-pay | Admitting: Emergency Medicine

## 2014-10-03 ENCOUNTER — Emergency Department (HOSPITAL_COMMUNITY): Payer: Medicare Other

## 2014-10-03 ENCOUNTER — Inpatient Hospital Stay (HOSPITAL_COMMUNITY)
Admission: EM | Admit: 2014-10-03 | Discharge: 2014-10-15 | DRG: 064 | Disposition: E | Payer: Medicare Other | Attending: Internal Medicine | Admitting: Internal Medicine

## 2014-10-03 DIAGNOSIS — I482 Chronic atrial fibrillation, unspecified: Secondary | ICD-10-CM

## 2014-10-03 DIAGNOSIS — Z888 Allergy status to other drugs, medicaments and biological substances status: Secondary | ICD-10-CM | POA: Diagnosis not present

## 2014-10-03 DIAGNOSIS — I629 Nontraumatic intracranial hemorrhage, unspecified: Secondary | ICD-10-CM

## 2014-10-03 DIAGNOSIS — Z79899 Other long term (current) drug therapy: Secondary | ICD-10-CM | POA: Diagnosis not present

## 2014-10-03 DIAGNOSIS — R4182 Altered mental status, unspecified: Secondary | ICD-10-CM | POA: Diagnosis present

## 2014-10-03 DIAGNOSIS — R7989 Other specified abnormal findings of blood chemistry: Secondary | ICD-10-CM | POA: Diagnosis present

## 2014-10-03 DIAGNOSIS — Z7952 Long term (current) use of systemic steroids: Secondary | ICD-10-CM

## 2014-10-03 DIAGNOSIS — I341 Nonrheumatic mitral (valve) prolapse: Secondary | ICD-10-CM | POA: Diagnosis present

## 2014-10-03 DIAGNOSIS — Z66 Do not resuscitate: Secondary | ICD-10-CM | POA: Diagnosis present

## 2014-10-03 DIAGNOSIS — Z515 Encounter for palliative care: Secondary | ICD-10-CM

## 2014-10-03 DIAGNOSIS — I10 Essential (primary) hypertension: Secondary | ICD-10-CM | POA: Diagnosis present

## 2014-10-03 DIAGNOSIS — I615 Nontraumatic intracerebral hemorrhage, intraventricular: Principal | ICD-10-CM | POA: Diagnosis present

## 2014-10-03 DIAGNOSIS — I214 Non-ST elevation (NSTEMI) myocardial infarction: Secondary | ICD-10-CM

## 2014-10-03 DIAGNOSIS — K219 Gastro-esophageal reflux disease without esophagitis: Secondary | ICD-10-CM | POA: Diagnosis present

## 2014-10-03 DIAGNOSIS — Z885 Allergy status to narcotic agent status: Secondary | ICD-10-CM

## 2014-10-03 DIAGNOSIS — Z7901 Long term (current) use of anticoagulants: Secondary | ICD-10-CM

## 2014-10-03 DIAGNOSIS — J9690 Respiratory failure, unspecified, unspecified whether with hypoxia or hypercapnia: Secondary | ICD-10-CM | POA: Diagnosis present

## 2014-10-03 DIAGNOSIS — I619 Nontraumatic intracerebral hemorrhage, unspecified: Secondary | ICD-10-CM | POA: Diagnosis present

## 2014-10-03 DIAGNOSIS — I4891 Unspecified atrial fibrillation: Secondary | ICD-10-CM

## 2014-10-03 DIAGNOSIS — R778 Other specified abnormalities of plasma proteins: Secondary | ICD-10-CM | POA: Diagnosis present

## 2014-10-03 DIAGNOSIS — E039 Hypothyroidism, unspecified: Secondary | ICD-10-CM | POA: Diagnosis present

## 2014-10-03 DIAGNOSIS — R451 Restlessness and agitation: Secondary | ICD-10-CM | POA: Diagnosis present

## 2014-10-03 DIAGNOSIS — Z86718 Personal history of other venous thrombosis and embolism: Secondary | ICD-10-CM

## 2014-10-03 DIAGNOSIS — T45515A Adverse effect of anticoagulants, initial encounter: Secondary | ICD-10-CM | POA: Diagnosis present

## 2014-10-03 DIAGNOSIS — Z87891 Personal history of nicotine dependence: Secondary | ICD-10-CM

## 2014-10-03 DIAGNOSIS — G9340 Encephalopathy, unspecified: Secondary | ICD-10-CM

## 2014-10-03 DIAGNOSIS — R4189 Other symptoms and signs involving cognitive functions and awareness: Secondary | ICD-10-CM

## 2014-10-03 DIAGNOSIS — R40243 Glasgow coma scale score 3-8, unspecified time: Secondary | ICD-10-CM

## 2014-10-03 LAB — PROTIME-INR
INR: 2.37 — ABNORMAL HIGH (ref 0.00–1.49)
PROTHROMBIN TIME: 26.1 s — AB (ref 11.6–15.2)

## 2014-10-03 LAB — CBC WITH DIFFERENTIAL/PLATELET
Basophils Absolute: 0 10*3/uL (ref 0.0–0.1)
Basophils Relative: 0 % (ref 0–1)
Eosinophils Absolute: 0 10*3/uL (ref 0.0–0.7)
Eosinophils Relative: 0 % (ref 0–5)
HCT: 41.8 % (ref 36.0–46.0)
Hemoglobin: 13.5 g/dL (ref 12.0–15.0)
Lymphocytes Relative: 6 % — ABNORMAL LOW (ref 12–46)
Lymphs Abs: 0.9 10*3/uL (ref 0.7–4.0)
MCH: 31.8 pg (ref 26.0–34.0)
MCHC: 32.3 g/dL (ref 30.0–36.0)
MCV: 98.4 fL (ref 78.0–100.0)
Monocytes Absolute: 1.2 10*3/uL — ABNORMAL HIGH (ref 0.1–1.0)
Monocytes Relative: 8 % (ref 3–12)
Neutro Abs: 13.2 10*3/uL — ABNORMAL HIGH (ref 1.7–7.7)
Neutrophils Relative %: 86 % — ABNORMAL HIGH (ref 43–77)
Platelets: 204 10*3/uL (ref 150–400)
RBC: 4.25 MIL/uL (ref 3.87–5.11)
RDW: 13.9 % (ref 11.5–15.5)
WBC: 15.3 10*3/uL — ABNORMAL HIGH (ref 4.0–10.5)

## 2014-10-03 LAB — COMPREHENSIVE METABOLIC PANEL WITH GFR
ALT: 16 U/L (ref 0–35)
AST: 30 U/L (ref 0–37)
Albumin: 3.8 g/dL (ref 3.5–5.2)
Alkaline Phosphatase: 78 U/L (ref 39–117)
Anion gap: 25 — ABNORMAL HIGH (ref 5–15)
BUN: 20 mg/dL (ref 6–23)
CO2: 18 meq/L — ABNORMAL LOW (ref 19–32)
Calcium: 9.3 mg/dL (ref 8.4–10.5)
Chloride: 98 meq/L (ref 96–112)
Creatinine, Ser: 1.06 mg/dL (ref 0.50–1.10)
GFR calc Af Amer: 53 mL/min — ABNORMAL LOW
GFR calc non Af Amer: 45 mL/min — ABNORMAL LOW
Glucose, Bld: 206 mg/dL — ABNORMAL HIGH (ref 70–99)
Potassium: 4.3 meq/L (ref 3.7–5.3)
Sodium: 141 meq/L (ref 137–147)
Total Bilirubin: 0.5 mg/dL (ref 0.3–1.2)
Total Protein: 7.9 g/dL (ref 6.0–8.3)

## 2014-10-03 LAB — TROPONIN I: Troponin I: 0.37 ng/mL

## 2014-10-03 LAB — URINALYSIS, ROUTINE W REFLEX MICROSCOPIC
Bilirubin Urine: NEGATIVE
Glucose, UA: 250 mg/dL — AB
Ketones, ur: NEGATIVE mg/dL
Leukocytes, UA: NEGATIVE
Nitrite: NEGATIVE
Protein, ur: >300 mg/dL — AB
Specific Gravity, Urine: 1.017 (ref 1.005–1.030)
Urobilinogen, UA: 0.2 mg/dL (ref 0.0–1.0)
pH: 5.5 (ref 5.0–8.0)

## 2014-10-03 LAB — PHOSPHORUS: Phosphorus: 3.8 mg/dL (ref 2.3–4.6)

## 2014-10-03 LAB — I-STAT ARTERIAL BLOOD GAS, ED
ACID-BASE EXCESS: 1 mmol/L (ref 0.0–2.0)
Bicarbonate: 25.4 mEq/L — ABNORMAL HIGH (ref 20.0–24.0)
O2 Saturation: 100 %
TCO2: 27 mmol/L (ref 0–100)
pCO2 arterial: 39.9 mmHg (ref 35.0–45.0)
pH, Arterial: 7.411 (ref 7.350–7.450)
pO2, Arterial: 606 mmHg — ABNORMAL HIGH (ref 80.0–100.0)

## 2014-10-03 LAB — RAPID URINE DRUG SCREEN, HOSP PERFORMED
Amphetamines: NOT DETECTED
Barbiturates: NOT DETECTED
Benzodiazepines: NOT DETECTED
Cocaine: NOT DETECTED
OPIATES: NOT DETECTED
Tetrahydrocannabinol: NOT DETECTED

## 2014-10-03 LAB — URINE MICROSCOPIC-ADD ON

## 2014-10-03 LAB — CK: Total CK: 580 U/L — ABNORMAL HIGH (ref 7–177)

## 2014-10-03 LAB — MAGNESIUM: Magnesium: 2 mg/dL (ref 1.5–2.5)

## 2014-10-03 LAB — I-STAT CG4 LACTIC ACID, ED: Lactic Acid, Venous: 9.07 mmol/L — ABNORMAL HIGH (ref 0.5–2.2)

## 2014-10-03 LAB — APTT: aPTT: 33 s (ref 24–37)

## 2014-10-03 LAB — CBG MONITORING, ED: GLUCOSE-CAPILLARY: 216 mg/dL — AB (ref 70–99)

## 2014-10-03 MED ORDER — LORAZEPAM 2 MG/ML IJ SOLN
1.0000 mg | INTRAMUSCULAR | Status: DC | PRN
  Administered 2014-10-04: 1 mg via INTRAVENOUS
  Filled 2014-10-03: qty 1

## 2014-10-03 MED ORDER — PROTHROMBIN COMPLEX CONC HUMAN 500 UNITS IV KIT: 25.0000 [IU]/kg | PACK | INTRAVENOUS | Status: DC

## 2014-10-03 MED ORDER — PROPOFOL 10 MG/ML IV EMUL
INTRAVENOUS | Status: AC
  Administered 2014-10-03: 5 ug/kg/min via INTRAVENOUS
  Filled 2014-10-03: qty 100

## 2014-10-03 MED ORDER — SODIUM CHLORIDE 0.9 % IV SOLN
1.0000 mg/h | INTRAVENOUS | Status: DC
  Administered 2014-10-03: 1 mg/h via INTRAVENOUS
  Filled 2014-10-03: qty 10

## 2014-10-03 MED ORDER — LIDOCAINE HCL (CARDIAC) 20 MG/ML IV SOLN
INTRAVENOUS | Status: AC
  Filled 2014-10-03: qty 5

## 2014-10-03 MED ORDER — DEXTROSE 5 % IV SOLN
10.0000 mg | INTRAVENOUS | Status: AC
  Administered 2014-10-03: 10 mg via INTRAVENOUS
  Filled 2014-10-03: qty 1

## 2014-10-03 MED ORDER — NALOXONE HCL 1 MG/ML IJ SOLN
2.0000 mg | Freq: Once | INTRAMUSCULAR | Status: AC
  Administered 2014-10-03: 2 mg via INTRAMUSCULAR

## 2014-10-03 MED ORDER — ONDANSETRON HCL 4 MG/2ML IJ SOLN: 4.0000 mg | Freq: Four times a day (QID) | INTRAMUSCULAR | Status: DC | PRN

## 2014-10-03 MED ORDER — NALOXONE HCL 1 MG/ML IJ SOLN
INTRAMUSCULAR | Status: AC
  Administered 2014-10-03: 2 mg via INTRAMUSCULAR
  Filled 2014-10-03: qty 2

## 2014-10-03 MED ORDER — SODIUM CHLORIDE 0.9 % IV SOLN
Freq: Once | INTRAVENOUS | Status: AC
  Administered 2014-10-03: 12:00:00 via INTRAVENOUS

## 2014-10-03 MED ORDER — SODIUM CHLORIDE 0.9 % IV BOLUS (SEPSIS)
1000.0000 mL | Freq: Once | INTRAVENOUS | Status: AC
  Administered 2014-10-03: 1000 mL via INTRAVENOUS

## 2014-10-03 MED ORDER — VITAMIN K1 10 MG/ML IJ SOLN: 10.0000 mg | Freq: Once | INTRAVENOUS | Status: DC

## 2014-10-03 MED ORDER — ETOMIDATE 2 MG/ML IV SOLN
INTRAVENOUS | Status: AC
  Administered 2014-10-03: 15 mg via NASAL
  Filled 2014-10-03: qty 20

## 2014-10-03 MED ORDER — PROPOFOL 10 MG/ML IV EMUL
5.0000 ug/kg/min | Freq: Once | INTRAVENOUS | Status: AC
  Administered 2014-10-03: 5 ug/kg/min via INTRAVENOUS

## 2014-10-03 MED ORDER — ROCURONIUM BROMIDE 50 MG/5ML IV SOLN
50.0000 mg | Freq: Once | INTRAVENOUS | Status: AC
  Administered 2014-10-03: 50 mg via INTRAVENOUS
  Filled 2014-10-03: qty 5

## 2014-10-03 MED ORDER — ONDANSETRON HCL 4 MG PO TABS: 4.0000 mg | ORAL_TABLET | Freq: Four times a day (QID) | ORAL | Status: DC | PRN

## 2014-10-03 MED ORDER — SODIUM CHLORIDE 0.9 % IV SOLN
10.0000 mL/h | Freq: Once | INTRAVENOUS | Status: AC
  Administered 2014-10-03: 10 mL/h via INTRAVENOUS

## 2014-10-03 MED ORDER — ROCURONIUM BROMIDE 50 MG/5ML IV SOLN
INTRAVENOUS | Status: AC
  Administered 2014-10-03: 50 mg via INTRAVENOUS
  Filled 2014-10-03: qty 2

## 2014-10-03 MED ORDER — SUCCINYLCHOLINE CHLORIDE 20 MG/ML IJ SOLN
INTRAMUSCULAR | Status: AC
  Filled 2014-10-03: qty 1

## 2014-10-03 MED ORDER — ETOMIDATE 2 MG/ML IV SOLN
15.0000 mg | Freq: Once | INTRAVENOUS | Status: AC
  Administered 2014-10-03: 15 mg via NASAL

## 2014-10-03 NOTE — ED Notes (Signed)
Patient's daughter at bedside, Dr. Rhunette CroftNanavati made aware.

## 2014-10-03 NOTE — ED Notes (Signed)
Attempted Report 

## 2014-10-03 NOTE — ED Notes (Signed)
Pt returned from CT. Tolerated well.

## 2014-10-03 NOTE — ED Notes (Signed)
Pt from home.  Pt LSN 1943 10/02/14.  Pt's daughter called pt this morning, but no answer.  She called another sister who has key to pt's house.  Pt found on left side in bed with blood on pillow and unresponsive with snoring resp.  EMS called.  Pt with L side facial droop.  EMS reports decorticate posturing.  Pt with slight withdrawal to pain.  Pt unresponsive to verbal commands.

## 2014-10-03 NOTE — Progress Notes (Signed)
Follow-up visit w/family and met pt's daughter, brother and sister-in-law. Daughter said they have made the decision to not let her suffer. She was very tearful, but she and her brother were in agreement on their decision. Brother described that she had been resting since she arrived. Passed pt on to [facility] to continue to follow through intubation. Ernest Haber Chaplain  09/21/2014 1700  Clinical Encounter Type  Visited With Family  Visit Type Follow-up;ED;Trauma  Referral From Physician  Consult/Referral To Chaplain  Spiritual Encounters  Spiritual Needs Prayer;Emotional;Grief support  Stress Factors  Family Stress Factors Health changes;Loss;Loss of control;Major life changes

## 2014-10-03 NOTE — ED Notes (Signed)
This RN at bedside with Dr. Rhunette CroftNanavati to discuss plan of care with family.

## 2014-10-03 NOTE — Progress Notes (Signed)
When I arrived sister and niece were bedside.  Pt's son and niece's husband joined us were bedside. They were very tearful.  I listened as they described how the pt had impacted their lives. Pt's sister described how after both of their husband's passed, pt called and they decided the become neighbors. Pt's sister said she didn't have grandchildren and she treated her nieces and nephews more like grandchildren. Pt's daughter was called and was on her [place] from [place]. Pt's sister asked for prayer while she and pt's son were bedside. Offered continued support as ED physician explained that he and colleagues agreed upon her poor prognosis. Pt's brother just asked that she be kept on vent until his sister arrived. He explained their father passed before she got here and it was very difficult for her. Physician explain comfort care for pt. Pt's brother accepted and agreed w/dr's report.  Chaplain remained supportive until family was emotionally stable. Marjory Liesamela Carrington Holder Chaplain  10/14/2014 1700  Clinical Encounter Type  Visited With Family

## 2014-10-03 NOTE — ED Notes (Signed)
EDP aware of pt's elevated lab.

## 2014-10-03 NOTE — ED Notes (Signed)
Inserted 7.5 ett tube 23 at lip per Dr. Johnney OuNavati EDP

## 2014-10-03 NOTE — ED Notes (Signed)
Dr. Rhunette CroftNanavati and Trey PaulaJeff, RT at bedside to extubate patient

## 2014-10-03 NOTE — ED Notes (Signed)
This RN, and Trey PaulaJeff RT transporting patient to CT.

## 2014-10-03 NOTE — ED Notes (Signed)
Assisted Ana BurtonEmily, RN with insertion of foley catheter

## 2014-10-03 NOTE — H&P (Signed)
[facility] History and Physical  Ana Griffith WUJ:[id number] DOB: 10/26/1924 DOA: 10/07/2014  Referring physician: ER physician. PCP: Charm BargesSCOTT,CHARLENE S, MD   Chief Complaint: Altered mental status.  History obtained from patient's daughter.  HPI: Ana Griffith is a 80 y.o. female with history of atrial fibrillation on Coumadin, hypothyroidism, hypertension, GERD was found to be unresponsive at her home by her family this evening. Patient as per patient's daughter last night had told her daughter over the phone that she was not feeling well. This morning patient tried to contact but was unable to reach patient through the phone. Later in the evening patient's family went to patient's house and found unresponsive on her bed. By the side of the patient there was a puddle of blood. ER CT head was done which shows extensive right intracerebral hematoma extend into the basal ganglia and lateral ventricle with midline shift. After discussing with patient's family at this time patient's family has requested comfort measures and patient has been started on morphine drip and admitted for comfort measures only. Patient was terminally extubated.  Review of Systems: As presented in the history of presenting illness, rest negative.  Past Medical History  Diagnosis Date  . Chest pain, unspecified     cardiac cath 1999 - normal cors. Myoview 2009 EF 72. normal perfusion  . Atrial fibrillation     chronic  . DVT (deep venous thrombosis)     In setting of full anti-coagulation. IVC filter placed  . Hematuria   . Hypothyroidism   . Anemia     iron deficient  . GERD (gastroesophageal reflux disease)   . Benign positional vertigo   . Mitral valve prolapse   . Intraabdominal hemorrhage     lovenox therapy  . Bilateral renal cysts    Past Surgical History  Procedure Laterality Date  . Cataract extraction  2006  . Tonsillectomy and adenoidectomy    . Endometrial polyp removed  1998  . Endometrial  biopsy      x2  . Skin cancer excision  2002    nose  . Ivc filter placement  11/08   Social History:  reports that she quit smoking about 34 years ago. She has never used smokeless tobacco. She reports that she does not drink alcohol or use illicit drugs. Where does patient live home. Can patient participate in ADLs? No.  Allergies  Allergen Reactions  . Codeine   . Lodine [Etodolac]     Family History:  Family History  Problem Relation Age of Onset  . Heart attack Mother   . Rheumatic fever Father   . Hypothyroidism Sister   . Throat cancer Father   . Breast cancer Sister   . Hypercholesterolemia Sister       Prior to Admission medications   Medication Sig Start Date End Date Taking? Authorizing Provider  amoxicillin (AMOXIL) 500 MG capsule Take 1 capsule (500 mg total) by mouth 2 (two) times daily. 07/24/14   Dale Durhamharlene Scott, MD  Calcium Carbonate-Vitamin D (CALCIUM 600/VITAMIN D) 600-400 MG-UNIT per tablet Take 1 tablet by mouth daily.      Historical Provider, MD  HYDROcodone-acetaminophen (NORCO/VICODIN) 5-325 MG per tablet Take 1 tablet by mouth every 6 (six) hours as needed for moderate pain. 04/20/14   Raquel Conni ElliotM Rey, NP  predniSONE (DELTASONE) 10 MG tablet Take 10 mg by mouth daily with breakfast.    Historical Provider, MD  SYNTHROID 75 MCG tablet May substitute generic.  One per day 08/14/14  Dale [place], MD  warfarin (COUMADIN) 6 MG tablet Take 6 mg by mouth daily.    Historical Provider, MD    Physical Exam: Filed Vitals:   10/01/2014 1845 09/19/2014 1900 09/21/2014 1930 10/06/2014 1950  BP: 133/58     Pulse: 96 75 74 71  Temp:      TempSrc:      Resp: 22 23 23 23   Height:      Weight:      SpO2: 96% 95% 92% 94%     General:  Well-developed and nourished.  Eyes: Anicteric no pallor.  ENT: No discharge from the ears eyes. There is small bite mark on the lips.  Neck: No mass felt.  Cardiovascular: S1-S2 heard.  Respiratory: No rhonchi or  crepitations.  Abdomen: Soft nontender.  Skin: No rash.  Musculoskeletal: No edema.   Psychiatric: Patient is not responsive.  Neurologic: Patient is not responsive.  Labs on Admission:  Basic Metabolic Panel:  Recent Labs Lab 09/23/2014 1033  NA 141  K 4.3  CL 98  CO2 18*  GLUCOSE 206*  BUN 20  CREATININE 1.06  CALCIUM 9.3  MG 2.0  PHOS 3.8   Liver Function Tests:  Recent Labs Lab 10/10/2014 1033  AST 30  ALT 16  ALKPHOS 78  BILITOT 0.5  PROT 7.9  ALBUMIN 3.8   No results found for this basename: LIPASE, AMYLASE,  in the last 168 hours No results found for this basename: AMMONIA,  in the last 168 hours CBC:  Recent Labs Lab 10/13/2014 1033  WBC 15.3*  NEUTROABS 13.2*  HGB 13.5  HCT 41.8  MCV 98.4  PLT 204   Cardiac Enzymes:  Recent Labs Lab 09/23/2014 1033  CKTOTAL 580*  TROPONINI 0.37*    BNP (last 3 results) No results found for this basename: PROBNP,  in the last 8760 hours CBG:  Recent Labs Lab 10/13/2014 1037  GLUCAP 216*    Radiological Exams on Admission: Ct Head Wo Contrast  10/01/2014   CLINICAL DATA:  Unresponsive with left facial droop. Initial encounter.  EXAM: CT HEAD WITHOUT CONTRAST  TECHNIQUE: Contiguous axial images were obtained from the base of the skull through the vertex without intravenous contrast.  COMPARISON:  None.  FINDINGS: There is a large acute right cerebral hemisphere hematoma. This has somewhat ill-defined margins, but measures at least 7.0 x 5.1 cm transverse. Its epicenter is within the right frontoparietal white matter, although there is extension into the right basal ganglia. There is moderate surrounding vasogenic edema. There is resulting 1.7 cm of right to left midline shift. The left lateral ventricle is moderately dilated and contains dependent blood. No extra-axial fluid collection is identified.  The visualized paranasal sinuses, mastoid air cells and middle ears are clear. The calvarium is intact.   IMPRESSION: Large acute right cerebral hematoma with extension into the basal ganglia and lateral ventricle. There is significant midline shift and obstructive hydrocephalus. Findings likely represent hypertensive hemorrhage, although other considerations would include hypertensive stroke, hemorrhagic lesion and amyloid angiopathy.  Critical Value/emergent results were called by telephone at the time of interpretation on 09/24/2014 at 12:43 pm to [place], [place] for Dr. Derwood Kaplan , who verbally acknowledged these results.   Electronically Signed   By: Roxy Horseman M.D.   On: 09/24/2014 12:48   Dg Chest Port 1 View  10/02/2014   CLINICAL DATA:  Found unresponsive, check endotracheal tube and nasogastric catheter placement  EXAM: PORTABLE CHEST - 1 VIEW  COMPARISON:  None.  FINDINGS: An endotracheal tube is noted 5 cm above the carina. The nasogastric catheter extends into the stomach. The cardiac shadow is mildly prominent accentuated by the portable technique. The lungs are well aerated bilaterally with mild interstitial change although no focal infiltrate is seen. No acute bony abnormality is noted.  IMPRESSION: Tubes and lines as described.  Mild interstitial change without focal infiltrate.   Electronically Signed   By: Alcide CleverMark  Lukens M.D.   On: 10/02/2014 11:36     Assessment/Plan Principal Problem:   Intracerebral bleed Active Problems:   Chronic atrial fibrillation   Elevated troponin   Acute encephalopathy   1. Acute encephalopathy secondary to massive intracerebral bleed with midline shift. 2. Elevated troponins. 3. History of atrial fibrillation on Coumadin. 4. Coagulopathy secondary to Coumadin. 5. History of hypothyroidism. 6. History of GERD. 7. History of hypertension.  Plan - at this time after discussing with patient's family who is at the bedside patient has been made complete comfort measures only and has been started on morphine drip. Patient also will be placed on when  necessary Ativan for any agitation. Patient will be moved to MedSurg floor for comfort care and no further labs will be ordered. Patient was terminally extubated after discussing with patient's family.    Code Status: Patient is a DO NOT RESUSCITATE.  Family Communication: Family at the bedside.  Disposition Plan: Admit to inpatient.    Margee Trentham N. [facility] Pager [phone].  If 7PM-7AM, please contact night-coverage [email or web address] Password [email or web address] 09/28/2014, 8:09 PM

## 2014-10-03 NOTE — ED Notes (Signed)
Attempted report 

## 2014-10-03 NOTE — ED Notes (Signed)
FFP run at 69280ml/Hr per Verbal order from Dr. Rhunette CroftNanavati. Chaplain at bedside with family.

## 2014-10-03 NOTE — ED Notes (Signed)
Lactic acid results given to the charge tech, Malachi BondsIda for MD

## 2014-10-03 NOTE — ED Notes (Addendum)
Patient's family at bedside and updated.  Propofol not started at this time as patient is not responsive except to painful stimuli, EDP in agreement.

## 2014-10-03 NOTE — ED Notes (Signed)
Head of bed at 30degrees per EDP order.

## 2014-10-03 NOTE — ED Notes (Signed)
Contacted CT regarding patient's need for CT Head, gave CT this RN 's telephone number to contact when ready.

## 2014-10-03 NOTE — ED Provider Notes (Signed)
CSN: [id number]     Arrival date & time 09/18/2014  1026 History   First MD Initiated Contact with Patient 09/27/2014 1031     Chief Complaint  Patient presents with  . Altered Mental Status     (Consider location/radiation/quality/duration/timing/severity/associated sxs/prior Treatment) HPI Comments: 80 year old female with past history of hypertension, atrial fib, GERD, DVT and hypothyroidism on coumadin comes in with cc of unresponsive. Pt is FULL CODE. Pt was last seen at 8 pm. This AM, per EMS, daughter was calling the patient, there was no response, and so EMS was called. Per EMS, pt was unresponsive, and there was blood by her mouth. Pt was on her bed.  Arrives to the ER with GCS of 6 (1/1/4). Pt withdraws lower ext, L upper ext is paralyzed, and there is some decerebrate movement in the RLE. CBG > 200. No gag.  The history is provided by medical records and the EMS personnel.    Past Medical History  Diagnosis Date  . Chest pain, unspecified     cardiac cath 1999 - normal cors. Myoview 2009 EF 72. normal perfusion  . Atrial fibrillation     chronic  . DVT (deep venous thrombosis)     In setting of full anti-coagulation. IVC filter placed  . Hematuria   . Hypothyroidism   . Anemia     iron deficient  . GERD (gastroesophageal reflux disease)   . Benign positional vertigo   . Mitral valve prolapse   . Intraabdominal hemorrhage     lovenox therapy  . Bilateral renal cysts    Past Surgical History  Procedure Laterality Date  . Cataract extraction  2006  . Tonsillectomy and adenoidectomy    . Endometrial polyp removed  1998  . Endometrial biopsy      x2  . Skin cancer excision  2002    nose  . Ivc filter placement  11/08   Family History  Problem Relation Age of Onset  . Heart attack Mother   . Rheumatic fever Father   . Hypothyroidism Sister   . Throat cancer Father   . Breast cancer Sister   . Hypercholesterolemia Sister    History  Substance Use Topics  .  Smoking status: Former Smoker    Quit date: 12/16/1979  . Smokeless tobacco: Never Used  . Alcohol Use: No   OB History   Grav Para Term Preterm Abortions TAB SAB Ect Mult Living                 Review of Systems  Unable to perform ROS: Patient unresponsive      Allergies  Codeine and Lodine  Home Medications   Prior to Admission medications   Medication Sig Start Date End Date Taking? Authorizing Provider  amoxicillin (AMOXIL) 500 MG capsule Take 1 capsule (500 mg total) by mouth 2 (two) times daily. 07/24/14   Dale Attica, MD  Calcium Carbonate-Vitamin D (CALCIUM 600/VITAMIN D) 600-400 MG-UNIT per tablet Take 1 tablet by mouth daily.      Historical Provider, MD  HYDROcodone-acetaminophen (NORCO/VICODIN) 5-325 MG per tablet Take 1 tablet by mouth every 6 (six) hours as needed for moderate pain. 04/20/14   Raquel Conni Elliot, NP  predniSONE (DELTASONE) 10 MG tablet Take 10 mg by mouth daily with breakfast.    Historical Provider, MD  SYNTHROID 75 MCG tablet May substitute generic.  One per day 08/14/14   Dale , MD  warfarin (COUMADIN) 6 MG tablet Take 6 mg  by mouth daily.    Historical Provider, MD   BP 116/48  Pulse 81  Temp(Src) 97.7 F (36.5 C) (Core (Comment))  Resp 17  Ht 5\' 6"  (1.676 m)  Wt 130 lb (58.968 kg)  BMI 20.99 kg/m2  SpO2 100% Physical Exam  Nursing note and vitals reviewed. Constitutional: She appears well-developed.  HENT:  Pupils - 2 mm L and 3 mm R pupil  Neck: Neck supple.  Cardiovascular:  Tachycardia, irregular  Pulmonary/Chest: Effort normal.  Abdominal: Soft.  Neurological:  GCS - 6, unresponsive. Decerbrate posturing RUE. + babinski, Left side. No gag LUE - no movement. Bilateral lower ext - withdraws to pain.  Skin: Skin is warm.    ED Course  OG placement Date/Time: 09/23/2014 4:36 PM Performed by: Derwood Kaplan Authorized by: Derwood Kaplan Consent: The procedure was performed in an emergent situation. Required items:  required blood products, implants, devices, and special equipment available Patient identity confirmed: arm band Patient tolerance: Patient tolerated the procedure well with no immediate complications.   (including critical care time) Labs Review Labs Reviewed  CBC WITH DIFFERENTIAL - Abnormal; Notable for the following:    WBC 15.3 (*)    Neutrophils Relative % 86 (*)    Lymphocytes Relative 6 (*)    Neutro Abs 13.2 (*)    Monocytes Absolute 1.2 (*)    All other components within normal limits  COMPREHENSIVE METABOLIC PANEL - Abnormal; Notable for the following:    CO2 18 (*)    Glucose, Bld 206 (*)    GFR calc non Af Amer 45 (*)    GFR calc Af Amer 53 (*)    Anion gap 25 (*)    All other components within normal limits  PROTIME-INR - Abnormal; Notable for the following:    Prothrombin Time 26.1 (*)    INR 2.37 (*)    All other components within normal limits  TROPONIN I - Abnormal; Notable for the following:    Troponin I 0.37 (*)    All other components within normal limits  URINALYSIS, ROUTINE W REFLEX MICROSCOPIC - Abnormal; Notable for the following:    APPearance CLOUDY (*)    Glucose, UA 250 (*)    Hgb urine dipstick LARGE (*)    Protein, ur >300 (*)    All other components within normal limits  CK - Abnormal; Notable for the following:    Total CK 580 (*)    All other components within normal limits  URINE MICROSCOPIC-ADD ON - Abnormal; Notable for the following:    Bacteria, UA FEW (*)    Casts WBC CAST (*)    All other components within normal limits  I-STAT CG4 LACTIC ACID, ED - Abnormal; Notable for the following:    Lactic Acid, Venous 9.07 (*)    All other components within normal limits  CBG MONITORING, ED - Abnormal; Notable for the following:    Glucose-Capillary 216 (*)    All other components within normal limits  I-STAT ARTERIAL BLOOD GAS, ED - Abnormal; Notable for the following:    pO2, Arterial 606.0 (*)    Bicarbonate 25.4 (*)    All other  components within normal limits  APTT  URINE RAPID DRUG SCREEN (HOSP PERFORMED)  MAGNESIUM  PHOSPHORUS  BLOOD GAS, ARTERIAL  PREPARE FRESH FROZEN PLASMA    Imaging Review Ct Head Wo Contrast  09/28/2014   CLINICAL DATA:  Unresponsive with left facial droop. Initial encounter.  EXAM: CT HEAD WITHOUT CONTRAST  TECHNIQUE: Contiguous axial images were obtained from the base of the skull through the vertex without intravenous contrast.  COMPARISON:  None.  FINDINGS: There is a large acute right cerebral hemisphere hematoma. This has somewhat ill-defined margins, but measures at least 7.0 x 5.1 cm transverse. Its epicenter is within the right frontoparietal white matter, although there is extension into the right basal ganglia. There is moderate surrounding vasogenic edema. There is resulting 1.7 cm of right to left midline shift. The left lateral ventricle is moderately dilated and contains dependent blood. No extra-axial fluid collection is identified.  The visualized paranasal sinuses, mastoid air cells and middle ears are clear. The calvarium is intact.  IMPRESSION: Large acute right cerebral hematoma with extension into the basal ganglia and lateral ventricle. There is significant midline shift and obstructive hydrocephalus. Findings likely represent hypertensive hemorrhage, although other considerations would include hypertensive stroke, hemorrhagic lesion and amyloid angiopathy.  Critical Value/emergent results were called by telephone at the time of interpretation on 09/15/2014 at 12:43 pm to [place] for Dr. Derwood KaplanANKIT Lorene Samaan , who verbally acknowledged these results.   Electronically Signed   By: Roxy HorsemanBill  Veazey M.D.   On: 09/19/2014 12:48   Dg Chest Port 1 View  09/16/2014   CLINICAL DATA:  Found unresponsive, check endotracheal tube and nasogastric catheter placement  EXAM: PORTABLE CHEST - 1 VIEW  COMPARISON:  None.  FINDINGS: An endotracheal tube is noted 5 cm above the carina. The nasogastric  catheter extends into the stomach. The cardiac shadow is mildly prominent accentuated by the portable technique. The lungs are well aerated bilaterally with mild interstitial change although no focal infiltrate is seen. No acute bony abnormality is noted.  IMPRESSION: Tubes and lines as described.  Mild interstitial change without focal infiltrate.   Electronically Signed   By: Alcide CleverMark  Lukens M.D.   On: 09/17/2014 11:36     EKG Interpretation   Date/Time:  Tuesday October 03 2014 10:31:00 EDT Ventricular Rate:  147 PR Interval:    QRS Duration: 71 QT Interval:  333 QTC Calculation: 521 R Axis:   -17 Text Interpretation:  Atrial fibrillation with rapid V-rate Ventricular  bigeminy Borderline left axis deviation Consider anterior infarct ST  depression, probably rate related Confirmed by Rhunette CroftNANAVATI, MD, Janey GentaANKIT [phone]([id number])  on 10/11/2014 11:39:01 AM      MDM   Final diagnoses:  Unresponsive  Intracranial bleeding  Glasgow coma scale total score 3-8  Atrial fibrillation with RVR  NSTEMI (non-ST elevated myocardial infarction)    Pt comes in unresponsive.  DDx includes: ICH/ Stroke ACS Sepsis syndrome Infection - UTI/Pneumonia Electrolyte abnormality Drug overdose DKA Metabolic disorders including thyroid disorders, adrenal insufficiency Acute anemia Respiratory failure Seizures  Pt comes in unresponsive. Intubated - no gag, GCS of 6. Suspect hemorrhagic stroke, but ischemic stroke possible as well. If CT neg, seizure possible.  CBG was normal. No response to 2 mg im narcan.  Pt is in afib with RVR as well. Full code, ICU admission anticipated.   INTUBATION Performed by: Derwood KaplanNanavati, Inocencia Murtaugh  Required items: required blood products, implants, devices, and special equipment available Patient identity confirmed: provided demographic data and hospital-assigned identification number Time out: Immediately prior to procedure a "time out" was called to verify the correct patient,  procedure, equipment, support staff and site/side marked as required.  Indications: airway control  Intubation method: Direct Laryngoscopy   Preoxygenation: BVM  Sedatives: 15 mg Etomidate Paralytic: 50 mg Rocoronium  Tube Size: 7.5  cuffed  Post-procedure  assessment: chest rise and ETCO2 monitor Breath sounds: equal and absent over the epigastrium Tube secured with: ETT holder Chest x-ray interpreted by radiologist and me.  Chest x-ray findings: endotracheal tube in appropriate position  Patient tolerated the procedure well with no immediate complications.     4:15 PM Pt noted to have a large bleed, with midline shift. Spoke with Nsurgery, pt is not a candidate for surgery. FFP and Vit K given. Spoke with son and some other family members, based on the severity of the bleed, and pt being a non operative case, they do not want us to escalate care any further. Pt[place]. They want pt on vent for now.  They will likely consider comfort care after daughter arrives.   CRITICAL CARE Performed by: Derwood KaplanNanavati, Marcela Alatorre   Total critical care time: 120 minutes  Critical care time was exclusive of separately billable procedures and treating other patients.  Critical care was necessary to treat or prevent imminent or life-threatening deterioration.  Critical care was time spent personally by me on the following activities: development of treatment plan with patient and/or surrogate as well as nursing, discussions with consultants, evaluation of patient's response to treatment, examination of patient, obtaining history from patient or surrogate, ordering and performing treatments and interventions, ordering and review of laboratory studies, ordering and review of radiographic studies, pulse oximetry and re-evaluation of patient's condition.   5:34 PM Spoke with daughter and rest of the family. They have had a fam meeting, and decided to deescalate care, and  extubate. Pt will be admitted to medicine floor.    Derwood KaplanAnkit Lynden Flemmer, MD 10/01/2014 1731

## 2014-10-03 NOTE — Procedures (Signed)
Extubation Procedure Note  Patient Details:   Name: Ernestene KielBetty F Vanvorst DOB: 09/01/1924 MRN: [id number]   Airway Documentation:  Airway 7.5 mm (Active)  Secured at (cm) 23 cm 29-Jul-2014 10:53 AM  Measured From Lips 29-Jul-2014 10:53 AM  Secured Location Center 29-Jul-2014 10:53 AM  Secured By [employer] Tube Holder 29-Jul-2014 10:53 AM    Evaluation  O2 sats: currently acceptable Complications: No apparent complications Patient did not tolerate procedure well.   Suctioning: Airway No Extubation was a terminal    Annarose Ouellet, Duane LopeJeffrey D 29-Jul-2014, 5:58 PM

## 2014-10-03 NOTE — ED Notes (Signed)
Dr. Rhunette CroftNanavati aware of BP decrease and is at bedside.

## 2014-10-03 NOTE — Progress Notes (Signed)
Chaplain followed up on referral from chaplain Elita QuickPam.  Lunette StandsChaplain was with family as life support was removed and remained at bedside with pt and family following.  Family was surrounding bedside telling stories about pt.  Family was laughing and crying as they shared the story of pt's life.  Chaplain left as family seemed to be supporting one another after chaplain provided spiritual and emotional support as well as empathetic listening and hospitality.  Chaplain will follow up with pt and family as needed.    10/02/2014 1730  Clinical Encounter Type  Visited With Patient and family together;Health care provider  Visit Type Follow-up;Spiritual support;Social support;Critical Care;ED;Patient actively dying  Referral From Chaplain  Spiritual Encounters  Spiritual Needs Prayer;Emotional;Grief support  Stress Factors  Family Stress Factors Exhausted;Family relationships;Health changes;Loss;Major life changes   Blain PaisOvercash, Raegyn Renda A, Chaplain

## 2014-10-03 NOTE — ED Notes (Signed)
Dr. Kakrakandy at bedside. 

## 2014-10-03 NOTE — ED Notes (Signed)
Dr. Nanavati at bedside 

## 2014-10-03 NOTE — ED Notes (Signed)
Patient's Monitor placed on "Comfort Care" per EDP and family request.

## 2014-10-04 LAB — PREPARE FRESH FROZEN PLASMA
UNIT DIVISION: 0
UNIT DIVISION: 0

## 2014-10-04 MED ORDER — ATROPINE SULFATE 1 % OP SOLN
2.0000 [drp] | Freq: Four times a day (QID) | OPHTHALMIC | Status: DC
  Filled 2014-10-04: qty 2

## 2014-10-15 NOTE — Progress Notes (Signed)
Patient just deceased. Family was at bedside. Physician notified.

## 2014-10-15 NOTE — Progress Notes (Signed)
Pt expired at 0238. Admitted tonight after massive intracerebral bleed and was placed on comfort care. Pronounced by 2 RNs. NP completed death certificate and comforted family. Family very grateful of good care received here. KJKG, NP

## 2014-10-15 NOTE — Progress Notes (Signed)
Patient just arrived to the floor from the ED, accompanied by family. Patient is unresponsive. Initial assessment completed.

## 2014-10-15 NOTE — Progress Notes (Signed)
UR complete.  Mykelle Cockerell RN, MSN 

## 2014-10-15 DEATH — deceased

## 2014-10-19 NOTE — Discharge Summary (Signed)
Death summary -   HPI: Ana Griffith is a 80 y.o. female with history of atrial fibrillation on Coumadin, hypothyroidism, hypertension, GERD was found to be unresponsive at her home by her family this evening. Patient as per patient's daughter last night had told her daughter over the phone that she was not feeling well. This morning patient tried to contact but was unable to reach patient through the phone. Later in the evening patient's family went to patient's house and found unresponsive on her bed. By the side of the patient there was a puddle of blood. ER CT head was done which shows extensive right intracerebral hematoma extend into the basal ganglia and lateral ventricle with midline shift. After discussing with patient's family at this time patient's family has requested comfort measures and patient has been started on morphine drip and admitted for comfort measures only. Patient was terminally extubated.  Course in hospital - patient is admitted for comfort care and was placed on morphine drip. Patient expired around 2:38 AM on 07-10-2014 and patient's family was notified.  Final diagnosis -  #1. Cardiac respiratory arrest. #2. Massive intracranial bleed with midline shift. #3. Coagulopathy secondary to Coumadin. #4. Atrial fibrillation. #5. Hypothyroidism.  Midge MiniumArshad Loucille Takach

## 2014-11-21 ENCOUNTER — Other Ambulatory Visit: Payer: Medicare Other

## 2014-11-23 ENCOUNTER — Ambulatory Visit: Payer: Medicare Other | Admitting: Internal Medicine

## 2015-04-06 NOTE — Consult Note (Signed)
Chief Complaint:  Subjective/Chief Complaint Hungry today. Sitting chair. Looks more alert today . Pain ok. No bleeding.   VITAL SIGNS/ANCILLARY NOTES: **Vital Signs.:   23-Nov-14 10:00  Vital Signs Type Q 4hr  Temperature Temperature (F) 98.5  Celsius 36.9  Temperature Source oral  Pulse Pulse 79  Respirations Respirations 18  Systolic BP Systolic BP 188  Diastolic BP (mmHg) Diastolic BP (mmHg) 70  Mean BP 85  Pulse Ox % Pulse Ox % 98  Pulse Ox Activity Level  At rest  Oxygen Delivery 3L  *Intake and Output.:   Daily 23-Nov-14 07:00  Grand Totals Intake:  3082.8 Output:  600    Net:  2482.8 24 Hr.:  2482.8  Oral Intake      In:  960  IV (Primary)      In:  2122.8  Urine ml     Out:  600  Length of Stay Totals Intake:  5533.8 Output:  1550    Net:  3983.8   Brief Assessment:  GEN well developed, well nourished, no acute distress, thin   Cardiac Irregular  murmur present   Respiratory normal resp effort  clear BS   Gastrointestinal Normal   Gastrointestinal details normal Soft  mild tenderness at surgical site.   EXTR negative cyanosis/clubbing, negative edema   Lab Results: Routine Chem:  22-Nov-14 05:49   Glucose, Serum 86  BUN  22  Creatinine (comp) 0.98  Sodium, Serum 137  Potassium, Serum 4.2  Chloride, Serum 104  CO2, Serum 29  Calcium (Total), Serum 8.9  Anion Gap  4  Osmolality (calc) 276  eGFR (African American) >60  eGFR (Non-African American)  52 (eGFR values <33m/min/1.73 m2 may be an indication of chronic kidney disease (CKD). Calculated eGFR is useful in patients with stable renal function. The eGFR calculation will not be reliable in acutely ill patients when serum creatinine is changing rapidly. It is not useful in  patients on dialysis. The eGFR calculation may not be applicable to patients at the low and high extremes of body sizes, pregnant women, and vegetarians.)  23-Nov-14 06:34   Glucose, Serum 74  BUN  22  Creatinine  (comp) 0.92  Sodium, Serum 137  Potassium, Serum 3.8  Chloride, Serum 104  CO2, Serum 30  Calcium (Total), Serum  8.3  Anion Gap  3  Osmolality (calc) 276  eGFR (African American) >60  eGFR (Non-African American)  56 (eGFR values <645mmin/1.73 m2 may be an indication of chronic kidney disease (CKD). Calculated eGFR is useful in patients with stable renal function. The eGFR calculation will not be reliable in acutely ill patients when serum creatinine is changing rapidly. It is not useful in  patients on dialysis. The eGFR calculation may not be applicable to patients at the low and high extremes of body sizes, pregnant women, and vegetarians.)  Routine Hem:  22-Nov-14 05:49   WBC (CBC) 9.3  RBC (CBC)  3.72  Hemoglobin (CBC) 12.1  Hematocrit (CBC) 35.6  Platelet Count (CBC)  129  MCV 96  MCH 32.6  MCHC 34.0  RDW 13.3  Neutrophil % 81.4  Lymphocyte % 9.1  Monocyte % 9.1  Eosinophil % 0.1  Basophil % 0.3  Neutrophil #  7.6  Lymphocyte #  0.8  Monocyte # 0.8  Eosinophil # 0.0  Basophil # 0.0 (Result(s) reported on 05 Nov 2013 at 07:16AM.)  23-Nov-14 06:34   WBC (CBC) 8.7  RBC (CBC)  3.28  Hemoglobin (CBC)  10.7  Hematocrit (CBC)  31.3  Platelet Count (CBC)  125  MCV 95  MCH 32.6  MCHC 34.2  RDW 13.1  Neutrophil % 76.4  Lymphocyte % 11.9  Monocyte % 8.8  Eosinophil % 2.4  Basophil % 0.5  Neutrophil #  6.6  Lymphocyte # 1.0  Monocyte # 0.8  Eosinophil # 0.2  Basophil # 0.0 (Result(s) reported on 06 Nov 2013 at [facility].)   Radiology Results: Cardiology:    20-Nov-14 21:06, ECG  Ventricular Rate 90  Atrial Rate 107  QRS Duration 68  QT 360  QTc 440  R Axis -4  T Axis 101  ECG interpretation   Atrial fibrillation  Septal infarct (cited on or before 17-Dec-2009)  Abnormal ECG  When compared with ECG of 17-Dec-2009 11:02,  Questionable change in QRS axis  ----------unconfirmed----------  Confirmed by OVERREAD, NOT ([id number]), editor PEARSON, BARBARA ([id number])  on 11/07/2013 1:09:23 PM  ECG   CT:    20-Nov-14 23:08, CT Abdomen Pelvis WO for Stone  CT Abdomen Pelvis WO for Stone   REASON FOR EXAM:    LLQ pain, hematuria  COMMENTS:       PROCEDURE: CT  - CT ABDOMEN /PELVIS WO (STONE)  - Nov 03 2013 11:08PM     CLINICAL DATA:  Right lower abdominal pain    EXAM:  CT ABDOMEN AND PELVIS WITHOUT    TECHNIQUE:  Multidetector CT imaging of the abdomen and pelvis was performed  following the standard protocol without IV contrast.    COMPARISON:  Prior CT from 01/03/2008.  FINDINGS:  Emphysematous changes are seen within the partially visualized  lungs. Subpleural linear opacities most likely reflect atelectasis  and/or scarring.    The liver, gallbladder, spleen, adrenal glands, and pancreas  demonstrate a normal contrast enhanced appearance.    The large exophytic hypodense left renal cyst measures 4.5 x 5.0 cm,  similar to prior exam. No stones or hydronephrosis is seen within  the left kidney. A 1.6 x 1.8 cm cyst is present within the anterior  right kidney. No hydronephrosis or nephrolithiasis is seen within  the right kidney. No stones are seen along the course of theright  renal collecting system.  There is no evidence of bowel obstruction. The cecum is now a  positioned within the left abdomen just to the left of midline, new  is comprised to the prior study. There is swirling of mesenteric  vessels and a few loops of small bowel within the right lower  quadrant (series 2, image 39- 46). There is associated inflammatory  fat stranding with a small amount of free fluid within this region.  Finding is concerning for cecal volvulus. No associated obstruction  identified, although a air-fluid level is seen within the mildly  prominent cecum. No frank loculated collection to suggest abscess  identified, although evaluation is somewhat limited on this  noncontrast examination. No free intraperitoneal air. Theappendix  is not definitely  visualized.    Bladder is partially decompressed but grossly normal. A small 1.4 x  1.5 cm hypodense cystic lesion is seen within the region of the left  ovary (series 2, image 60), indeterminate. Right ovary is not  definitely visualized.    No enlarged intra-abdominal pelvic lymph nodes are identified.    Scattered calcified atheromatous disease is seen throughout the  intra-abdominal aorta and its branch vessels. IVC filter is in place  within the infrarenal IVC.    The multilevel degenerative changes are seen throughout the  visualized spine. No focal  lytic or blastic osseous lesions are  identified.     IMPRESSION:  1. Inflammatory fat stranding within the right lower quadrant with  swirling of mesenteric vasculature and loops of small bowel with the  cecum located in the mid -left hemi abdomen. 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 No evidence of frank bowel obstruction at this  time.    2.  No CT evidence of nephrolithiasis or obstructive uropathy.    3. 1.5 cm hypodense cystic left ovarian lesion, indeterminate.  Correlation with pelvic ultrasound and/or MRI could be performed as  clinically indicated.      Electronically Signed    By: Jeannine Boga M.D.    On: 11/03/2013 23:43     Verified By: Neomia Glass, M.D.,   Assessment/Plan:  Invasive Device Daily Assessment of Necessity:  Does the patient currently have any of the following indwelling devices? none   Assessment/Plan:  Assessment IMP Post op surgery Abdominal pain AFIB Weakness ,   Plan PLAN Advance diet Increase activity .ambulate DVT prophylasix Resume coumadin for AFIB Heparin for short term anticougulation Afib rate ok, Consider b-blocker if necessary   Electronic Signatures: Yolonda Kida (MD)  (Signed 26-Nov-14 19:29)  Authored: Chief Complaint, VITAL SIGNS/ANCILLARY NOTES, Brief Assessment, Lab Results, Radiology Results, Assessment/Plan   Last Updated:  26-Nov-14 19:29 by Lujean Amel D (MD)

## 2015-04-06 NOTE — Consult Note (Signed)
Chief Complaint:  Subjective/Chief Complaint Pt feeling better . not much pain unless she moves around. NPO so far . Denies palpitations.   VITAL SIGNS/ANCILLARY NOTES: **Vital Signs.:   22-Nov-14 09:46  Vital Signs Type Q 4hr  Temperature Temperature (F) 98.7  Celsius 37  Temperature Source oral  Pulse Pulse 87  Respirations Respirations 16  Systolic BP Systolic BP 211  Diastolic BP (mmHg) Diastolic BP (mmHg) 70  Mean BP 89  Pulse Ox % Pulse Ox % 97  Pulse Ox Activity Level  At rest  Oxygen Delivery 3L  *Intake and Output.:   22-Nov-14 16:30  Grand Totals Intake:  960 Output:      Net:  960 24 Hr.:  960  Oral Intake      In:  960  Percentage of Meal Eaten  50   Brief Assessment:  GEN well developed, well nourished, no acute distress   Cardiac Irregular   Respiratory normal resp effort  clear BS   Gastrointestinal Normal   Gastrointestinal details normal Soft   EXTR negative cyanosis/clubbing, negative edema   Lab Results: Hepatic:  20-Nov-14 21:02   Bilirubin, Total 0.7  Alkaline Phosphatase 98  SGPT (ALT) 21  SGOT (AST) 28  Total Protein, Serum 8.0  Albumin, Serum 4.1  Cardiology:  20-Nov-14 21:06   Ventricular Rate 90  Atrial Rate 107  QRS Duration 68  QT 360  QTc 440  R Axis -4  T Axis 101  ECG interpretation Atrial fibrillation Septal infarct (cited on or before 17-Dec-2009) Abnormal ECG When compared with ECG of 17-Dec-2009 11:02, Questionable change in QRS axis ----------unconfirmed---------- Confirmed by OVERREAD, NOT ([id number]), editor PEARSON, BARBARA ([id number]) on 11/07/2013 1:09:23 PM  Routine Chem:  20-Nov-14 21:02   Glucose, Serum  111  BUN  27  Creatinine (comp) 1.03  Sodium, Serum 136  Potassium, Serum 4.2  Chloride, Serum 104  CO2, Serum 27  Calcium (Total), Serum 9.5  Anion Gap  5  Osmolality (calc) 278  eGFR (African American)  57  eGFR (Non-African American)  49 (eGFR values <36mL/min/1.73 m2 may be an indication of  chronic kidney disease (CKD). Calculated eGFR is useful in patients with stable renal function. The eGFR calculation will not be reliable in acutely ill patients when serum creatinine is changing rapidly. It is not useful in  patients on dialysis. The eGFR calculation may not be applicable to patients at the low and high extremes of body sizes, pregnant women, and vegetarians.)  Lipase 260 (Result(s) reported on 03 Nov 2013 at 09:27PM.)  22-Nov-14 05:49   Glucose, Serum 86  BUN  22  Creatinine (comp) 0.98  Sodium, Serum 137  Potassium, Serum 4.2  Chloride, Serum 104  CO2, Serum 29  Calcium (Total), Serum 8.9  Anion Gap  4  Osmolality (calc) 276  eGFR (African American) >60  eGFR (Non-African American)  52 (eGFR values <105mL/min/1.73 m2 may be an indication of chronic kidney disease (CKD). Calculated eGFR is useful in patients with stable renal function. The eGFR calculation will not be reliable in acutely ill patients when serum creatinine is changing rapidly. It is not useful in  patients on dialysis. The eGFR calculation may not be applicable to patients at the low and high extremes of body sizes, pregnant women, and vegetarians.)  Routine UA:  20-Nov-14 21:02   Color (UA) Yellow  Clarity (UA) Clear  Glucose (UA) Negative  Bilirubin (UA) Negative  Ketones (UA) Negative  Specific Gravity (UA) 1.015  Blood (UA) 3+  pH (UA) 6.0  Protein (UA) 30 mg/dL  Nitrite (UA) Negative  Leukocyte Esterase (UA) Negative (Result(s) reported on 03 Nov 2013 at 10:00PM.)  RBC (UA) 268 /HPF  WBC (UA) 6 /HPF  Bacteria (UA) NONE SEEN  Epithelial Cells (UA) 1 /HPF  Mucous (UA) PRESENT (Result(s) reported on 03 Nov 2013 at 10:00PM.)  Routine Coag:  20-Nov-14 21:02   Prothrombin  17.4  INR 1.4 (INR reference interval applies to patients on anticoagulant therapy. A single INR therapeutic range for coumarins is not optimal for all indications; however, the suggested range for most  indications is 2.0 - 3.0. Exceptions to the INR Reference Range may include: Prosthetic heart valves, acute myocardial infarction, prevention of myocardial infarction, and combinations of aspirin and anticoagulant. The need for a higher or lower target INR must be assessed individually. Reference: The Pharmacology and Management of the Vitamin K  antagonists: the seventh ACCP Conference on Antithrombotic and Thrombolytic Therapy. IRJJO.8416 Sept:126 (3suppl): [id number]. A HCT value >55% may artifactually increase the PT.  In one study,  the increase was an average of 25%. Reference:  "Effect on Routine and Special Coagulation Testing Values of Citrate Anticoagulant Adjustment in Patients with High HCT Values." American Journal of Clinical Pathology 2006;126:400-405.)  Activated PTT (APTT)  36.3 (A HCT value >55% may artifactually increase the APTT. In one study, the increase was an average of 19%. Reference: "Effect on Routine and Special Coagulation Testing Values of Citrate Anticoagulant Adjustment in Patients with High HCT Values." American Journal of Clinical Pathology 2006;126:400-405.)  Routine Hem:  20-Nov-14 21:02   WBC (CBC) 9.5  RBC (CBC) 4.00  Hemoglobin (CBC) 12.9  Hematocrit (CBC) 38.0  Platelet Count (CBC) 161  MCV 95  MCH 32.3  MCHC 34.0  RDW 13.0  Neutrophil % 81.1  Lymphocyte % 9.8  Monocyte % 7.9  Eosinophil % 0.7  Basophil % 0.5  Neutrophil #  7.7  Lymphocyte #  0.9  Monocyte # 0.8  Eosinophil # 0.1  Basophil # 0.1 (Result(s) reported on 03 Nov 2013 at 09:17PM.)  22-Nov-14 05:49   WBC (CBC) 9.3  RBC (CBC)  3.72  Hemoglobin (CBC) 12.1  Hematocrit (CBC) 35.6  Platelet Count (CBC)  129  MCV 96  MCH 32.6  MCHC 34.0  RDW 13.3  Neutrophil % 81.4  Lymphocyte % 9.1  Monocyte % 9.1  Eosinophil % 0.1  Basophil % 0.3  Neutrophil #  7.6  Lymphocyte #  0.8  Monocyte # 0.8  Eosinophil # 0.0  Basophil # 0.0 (Result(s) reported on 05 Nov 2013 at 07:16AM.)    Radiology Results: Cardiology:    20-Nov-14 21:06, ECG  Ventricular Rate 90  Atrial Rate 107  QRS Duration 68  QT 360  QTc 440  R Axis -4  T Axis 101  ECG interpretation   Atrial fibrillation  Septal infarct (cited on or before 17-Dec-2009)  Abnormal ECG  When compared with ECG of 17-Dec-2009 11:02,  Questionable change in QRS axis  ----------unconfirmed----------  Confirmed by OVERREAD, NOT ([id number]), editor PEARSON, BARBARA ([id number]) on 11/07/2013 1:09:23 PM  ECG   CT:    20-Nov-14 23:08, CT Abdomen Pelvis WO for Stone  CT Abdomen Pelvis WO for Stone   REASON FOR EXAM:    LLQ pain, hematuria  COMMENTS:       PROCEDURE: CT  - CT ABDOMEN /PELVIS WO (STONE)  - Nov 03 2013 11:08PM     CLINICAL DATA:  Right lower abdominal pain  EXAM:  CT ABDOMEN AND PELVIS WITHOUT    TECHNIQUE:  Multidetector CT imaging of the abdomen and pelvis was performed  following the standard protocol without IV contrast.    COMPARISON:  Prior CT from 01/03/2008.  FINDINGS:  Emphysematous changes are seen within the partially visualized  lungs. Subpleural linear opacities most likely reflect atelectasis  and/or scarring.    The liver, gallbladder, spleen, adrenal glands, and pancreas  demonstrate a normal contrast enhanced appearance.    The large exophytic hypodense left renal cyst measures 4.5 x 5.0 cm,  similar to prior exam. No stones or hydronephrosis is seen within  the left kidney. A 1.6 x 1.8 cm cyst is present within the anterior  right kidney. No hydronephrosis or nephrolithiasis is seen within  the right kidney. No stones are seen along the course of theright  renal collecting system.  There is no evidence of bowel obstruction. The cecum is now a  positioned within the left abdomen just to the left of midline, new  is comprised to the prior study. There is swirling of mesenteric  vessels and a few loops of small bowel within the right lower  quadrant (series 2, image 39- 46). There  is associated inflammatory  fat stranding with a small amount of free fluid within this region.  Finding is concerning for cecal volvulus. No associated obstruction  identified, although a air-fluid level is seen within the mildly  prominent cecum. No frank loculated collection to suggest abscess  identified, although evaluation is somewhat limited on this  noncontrast examination. No free intraperitoneal air. Theappendix  is not definitely visualized.    Bladder is partially decompressed but grossly normal. A small 1.4 x  1.5 cm hypodense cystic lesion is seen within the region of the left  ovary (series 2, image 60), indeterminate. Right ovary is not  definitely visualized.    No enlarged intra-abdominal pelvic lymph nodes are identified.    Scattered calcified atheromatous disease is seen throughout the  intra-abdominal aorta and its branch vessels. IVC filter is in place  within the infrarenal IVC.    The multilevel degenerative changes are seen throughout the  visualized spine. No focal lytic or blastic osseous lesions are  identified.     IMPRESSION:  1. Inflammatory fat stranding within the right lower quadrant with  swirling of mesenteric vasculature and loops of small bowel with the  cecum located in the mid -left hemi abdomen. 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 No evidence of frank bowel obstruction at this  time.    2.  No CT evidence of nephrolithiasis or obstructive uropathy.    3. 1.5 cm hypodense cystic left ovarian lesion, indeterminate.  Correlation with pelvic ultrasound and/or MRI could be performed as  clinically indicated.      Electronically Signed    By: Jeannine Boga M.D.    On: 11/03/2013 23:43     Verified By: Neomia Glass, M.D.,   Assessment/Plan:  Invasive Device Daily Assessment of Necessity:  Does the patient currently have any of the following indwelling devices? foley   Indwelling Urinary Catheter continued,  requirement due to   Reason to continue Indwelling Urinary Catheter preop or postop order according to surgical protocols   Assessment/Plan:  Assessment IMP AFIB POD x 1 Palps Tachy .   Plan PLAN Hold on anticoug Rate control for AFIB Pain control post op Advance diet as tolerated Increase activity Agree with ECHO I do not rec cath I do  not rec cardioversion I will continue to follow with you   Electronic Signatures: Yolonda Kida (MD)  (Signed [phone] 15:52)  Authored: Chief Complaint, VITAL SIGNS/ANCILLARY NOTES, Brief Assessment, Lab Results, Radiology Results, Assessment/Plan   Last Updated: 26-Nov-14 15:52 by Lujean Amel D (MD)

## 2015-04-06 NOTE — H&P (Signed)
   Subjective/Chief Complaint Right sided abdominal pain, nausea/vomiting   History of Present Illness Ana Griffith is a pleasant 81 yo F who appears younger than her actual age who presents with approx 9 hours of right sided abdominal pain.  It began acutely today at 1600.  Associated with nausea/vomiting.  Describes pain as occuring in waves.  Has never had before.  Has not improved with pain medications.   Past History H/o intraabdominal bleed due to lovenox A fib History of DVT H/o cataract H/o hysteroscopy and D and C   Past Med/Surgical Hx:  Internal hemorrhage after Lovenox:   Hypothyroidism:   Atrial Fib:   cataract surgery to rt eye:   mexidema:   ALLERGIES:  Codeine: N/V/Diarrhea  Lovenox: Other  Family and Social History:  Family History Coronary Artery Disease  Diabetes Mellitus  Cancer   Social History negative tobacco, negative ETOH, negative Illicit drugs   Place of Living Lives alone in [place], here with family   Review of Systems:  Subjective/Chief Complaint Abdominal pain, nausea/vomiting   Fever/Chills No   Cough No   Sputum No   Abdominal Pain Yes   Diarrhea No   Constipation No   Nausea/Vomiting Yes   SOB/DOE No   Chest Pain No   Dysuria No   Tolerating Diet Nauseated  Vomiting   Physical Exam:  GEN well developed, well nourished, no acute distress   HEENT pink conjunctivae, PERRL   RESP normal resp effort  clear BS  no use of accessory muscles   CARD regular rate  no murmur  no thrills   ABD positive tenderness  no hernia  distended   EXTR negative cyanosis/clubbing, negative edema   SKIN normal to palpation, No rashes, No ulcers   NEURO cranial nerves intact, negative rigidity, negative tremor, follows commands   PSYCH A+O to time, place, person    Assessment/Admission Diagnosis Ana Griffith is a pleasant 81 yo F who presents with acute onset abdominal pain.  WBC 9.5 but with 81% neutrophils.  CT shows cecal volvulus.  Stranding  around colon, neutrophil predominance.   Plan To OR for emergent exploratory laparotomy, probable right hemicolectomy   Electronic Signatures: Jarvis NewcomerLundquist, Kaivon Livesey A (MD)  (Signed 21-Nov-14 01:29)  Authored: CHIEF COMPLAINT and HISTORY, PAST MEDICAL/SURGIAL HISTORY, ALLERGIES, FAMILY AND SOCIAL HISTORY, REVIEW OF SYSTEMS, PHYSICAL EXAM, ASSESSMENT AND PLAN   Last Updated: 21-Nov-14 01:29 by Jarvis NewcomerLundquist, Kin Galbraith A (MD)

## 2015-04-06 NOTE — Op Note (Signed)
PATIENT NAME:  Ana Griffith, Ana Griffith MR#:  [id number] DATE OF BIRTH:  [birth year]  DATE OF PROCEDURE:  11/04/2013  ATTENDING PHYSICIAN:  Carolynn Servehristopher Lindquist, M.D.   PREOPERATIVE DIAGNOSIS: Cecal volvulus.   POSTOPERATIVE DIAGNOSIS:  Cecal volvulus.   PROCEDURES PERFORMED: Exploratory laparotomy and right hemicolectomy.   ANESTHESIA: General.   ESTIMATED BLOOD LOSS: 50 mL.   COMPLICATIONS: None.   SPECIMEN: Right colon.   INDICATION FOR SURGERY:  Ana Griffith is a pleasant 81 year old who presented with acute onset abdominal pain. She was noted on CT to have a cecal volvulus and white cell count was mildly elevated. She was tachycardiac and quite uncomfortable. I just brought her to the Operating Room for surgical management of cecal volvulus.   DETAILS OF PROCEDURE: As follows: Consent was obtained. Ana Griffith was brought to the Operating Room suite. She was induced. Endotracheal tube was placed, general anesthesia was administered. Her abdomen was then prepped and draped in standard surgical fashion. A timeout was then performed correctly identifying the patient name, operative site and procedure to be performed. Upon visual and physical examination, the distended loop of bowel could be palpated. I then made a midline infraumbilical incision which was deepened down to the fascia. The fascia was incised. The peritoneum was carefully entered. The bowel was easily visualized and noted to be dilated and twisted upon itself. There appeared to be significant bands to the omentum which appeared to be holding the colon and preventing it from being de-torsed.  It was de-torsed, but due to the fact of increased risk of recurrence, a decision was made to perform a right hemicolectomy. A GIA 75 stapler was placed across the terminal ileum and fired. A similar GIA 75 was placed across the transverse colon to the right of the middle colic artery to transect normal appearing bowel. A combination of Harmonic and suture ties  were used to ligate the ileocolic and right colic. The specimen was then brought out. The 2 loops of small bowel and transverse colon were then then laid approximate to each other and two 3-0 silk stay sutures were placed. The corners were then cauterized and cut off medial to the staple lines and GIA 75 was placed into both lumens and fired to form a common channel. A TA 60 was then fired across the resultant enteral colotomy and the corner was then excised. The mesenteric defect was then closed with a running 3-0 Vicryl. The abdomen was then irrigated and the bowel was returned to the abdomen. The anastomosis was large and able to easily fit two fingers. The fascia was then closed with looped #1 PDS, ran from both directions and tied in the middle. The wound was then closed with staples. A sterile dressing was then placed over the wound. The patient was then awoken, extubated and brought to the postanesthesia care unit. There were no immediate complications. Needle, sponge and instrument counts correct at the end of the procedure.    ____________________________ Si Raiderhristopher A. Mylynn Dinh, MD cal:NTS D: 11/04/2013 03:55:17 ET T: 11/04/2013 04:42:19 ET JOB#: [id number]  cc: Cristal Deerhristopher A. Damieon Armendariz, MD, <Dictator> Jarvis NewcomerHRISTOPHER A Olney Monier MD ELECTRONICALLY SIGNED 11/12/2013 19:39

## 2015-04-06 NOTE — Discharge Summary (Signed)
PATIENT NAME:  Ana Griffith, [name] MR#:  [id number] DATE OF BIRTH:  [birth year]  DATE OF ADMISSION:  11/04/2013 DATE OF DISCHARGE:  11/11/2013  ATTENDING PHYSICIAN:  Ida Roguehristopher Yared Barefoot, MD  DISCHARGE DIAGNOSES: 1.  Cecal volvulus status post right hemicolectomy.  2.  History of atrial fibrillation.  3.  History of deep vein thrombosis and inferior vena cava filter.  4.  History of cataract surgery.  5.  History of hysteroscopy and dilation and curettage. 6.  History of intra-abdominal bleed due to Lovenox.   DISCHARGE MEDICATIONS:  1.  Coumadin 6 mg p.o. daily.  2.  Bactrim 875 mg b.i.d.  3.  Synthroid 75 mcg p.o. daily.  4.  Calcium plus vitamin D 1 tab p.o. daily.   INDICATION FOR ADMISSION: [name] is a pleasant 81 year old female who presented with acute abdominal pain and was found to have a cecal volvulus on CT. She was thus admitted for management of cecal volvulus.   HOSPITAL COURSE: [name] was brought to the operating room suite and underwent right hemicolectomy. Postoperatively, she was made n.p.o. and later advanced to liquids and regular diet when her bowel function had resumed and likewise she was begun on IV pain medicine and was transitioned to p.o. pain medicines once she was able to tolerate good p.o. She did have soft tissue that appeared to be an infected hematoma, and her wound was opened up and packed. She is otherwise doing fine, and at the time of discharge taking good p.o. with good p.o. pain control and is voiding and stooling without difficulty.    DISCHARGE INSTRUCTIONS: [name] is to call or return to the ED if has increased pain, nausea, vomiting, redness, or drainage from incision. She is to follow up with me in approximately 1 week.   ____________________________ Si Raiderhristopher A. Permelia Bamba, MD cal:sb D: 11/22/2013 13:43:57 ET T: 11/22/2013 13:58:10 ET JOB#: [id number]  cc: Cristal Deerhristopher A. Kamauri Denardo, MD, <Dictator> Jarvis NewcomerHRISTOPHER A Deshante Cassell MD ELECTRONICALLY SIGNED  11/24/2013 11:36

## 2015-04-07 NOTE — Consult Note (Signed)
PATIENT NAME:  Ana Griffith, [name] MR#:  [id number] DATE OF BIRTH:  1976-05-29  DATE OF CONSULTATION:  11/04/2013  REFERRING PHYSICIAN:  Dr. Juliann PulseLundquist.  CONSULTING PHYSICIAN:  Kowen Kluth D. Shyia Fillingim, MD  INDICATION: Atrial fibrillation, coronary disease.   HISTORY OF PRESENT ILLNESS: Ms. Ana Griffith is an 81 year old white female who presented with acute right-sided abdominal pain, began about 4:00, started having nausea, vomiting occurring in waves. Never had it before. Did not improve with medication. History of intra-abdominal bleed secondary to Lovenox in the past. She has had AFib, history of DVT, history of cataracts, hysterectomy. Reportedly got progressively worse and finally came in. She was seen. CT suggest a volvulus. She was subsequently taken to the operating room and had the volvulus reduced. She has done reasonably well and now is stable, in pain postop, but has a history of atrial fibrillation.   REVIEW OF SYSTEMS: Difficult to assess. The patient is postop from surgery.   PAST MEDICAL HISTORY: Intra-abdominal bleed secondary to anticoagulation, atrial fibrillation, DVT, cataracts, history of hysterectomy, coronary artery disease.   FAMILY HISTORY: Coronary artery disease, diabetes, cancer.   SOCIAL HISTORY: Retired. No smoking or alcohol consumption. No drug use. Lives in [place] with her family.   MEDICATIONS: Included Coumadin.   ALLERGIES: None.   PHYSICAL EXAMINATION:  VITAL SIGNS: Blood pressure at the time I saw the patient was 120/80, pulse 100, respiratory rate 16, afebrile.  HEENT: Normocephalic, atraumatic. Pupils equal and reactive to light.  NECK: Supple. No significant JVD, bruits or adenopathy.  LUNGS: Exam was clear to auscultation and percussion. No significant wheeze, rhonchi or rale.  HEART: Irregularly irregular. Systolic ejection murmur left sternal border.  ABDOMEN: Status post surgery with healing scar, diffuse tenderness.  NEUROLOGIC: Difficult to assess, on  medication, sedated  again, slightly sedated.  SKIN: Normal.   LABORATORY, DIAGNOSTIC AND RADIOLOGICAL DATA: CT suggests volvulus in the sigmoid area. PT/INR elevated at 17.   ASSESSMENT: Atrial fibrillation, sigmoid volvulus status post surgery, history of deep vein thrombosis in the past, history of cataracts, hypothyroidism.   PLAN: Agree with current therapy. Agree with medical care. Agree with surgical care. Agree with  management. For further symptoms, will treat the patient aggressively medically. Continue rate control for AFib. Hold off on anticoagulation for now. Consider echocardiogram. Do not recommend cardiac cath. Do not recommend any further medical therapy unless symptoms warrant. Rule out for myocardial infarction may be helpful. Will continue to treat the patient aggressively. We will wait for the patient to improve and convalesce from her surgery.    ____________________________ Bobbie Stackwayne D. Juliann Paresallwood, MD ddc:np D: 11/04/2013 17:18:41 ET T: 11/04/2013 18:06:59 ET JOB#: [id number]  cc: Caelie [place]burg D. Juliann Paresallwood, MD, <Dictator> Alwyn PeaWAYNE D Henrik Orihuela MD ELECTRONICALLY SIGNED 12/21/2013 10:25

## 2015-09-20 IMAGING — CT CT STONE STUDY
3 of 4 series · 4 of 16 positions shown, 5 images · non-contrast
Comparison: US PELV - US TRANSVAGINAL dated 02/24/2014; CT STONE
STUDY dated 11/03/2013; CT ABD-PELV W/ CM dated 01/03/2008

CLINICAL DATA: Right flank pain.  Microscopic hematuria.

EXAM:
CT ABDOMEN AND PELVIS WITHOUT CONTRAST
TECHNIQUE: Multidetector CT imaging of the abdomen and pelvis was performed
following the standard protocol without IV contrast.

[Series 4: lung · axial · 0.76mm/px · z∈[+728,+728]mm · 1 of 22 slices shown, 2 images]
[im 1/22  soft-tissue]
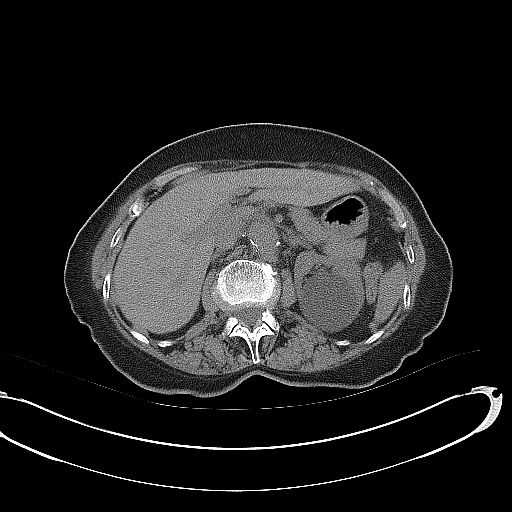
[im 1/22  bone]
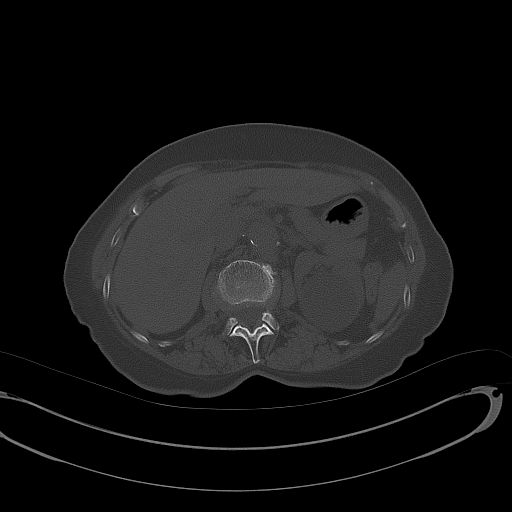

[Series 5: coronal · coronal · 0.73mm/px · 2 of 106 slices shown]
[im 36/106  soft-tissue]
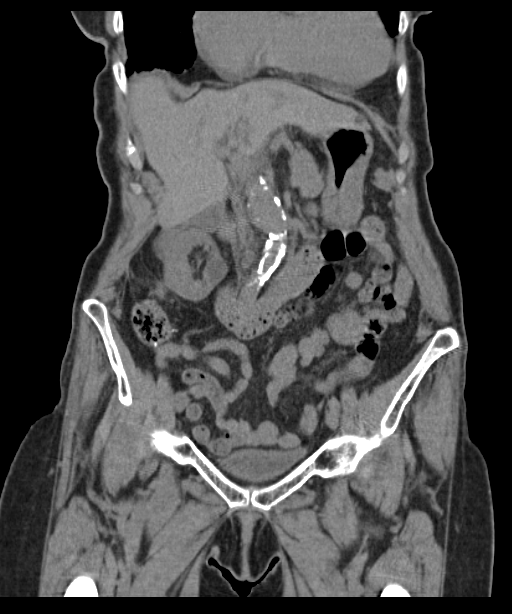
[im 71/106  soft-tissue]
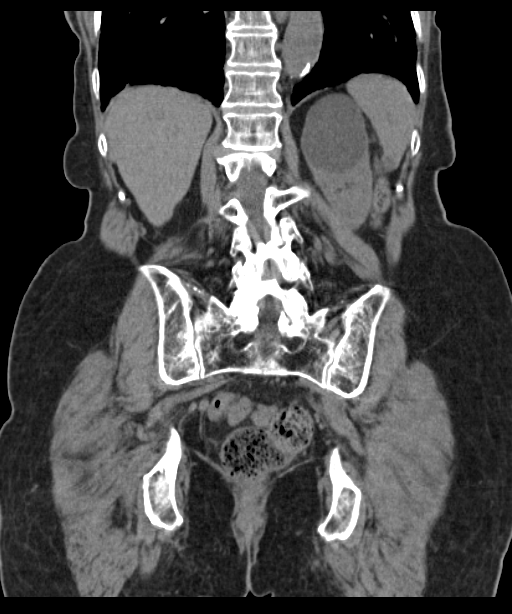

[Series 6: sagittal · sagittal · 0.52mm/px · 1 of 162 slices shown]
[im 81/162  soft-tissue]
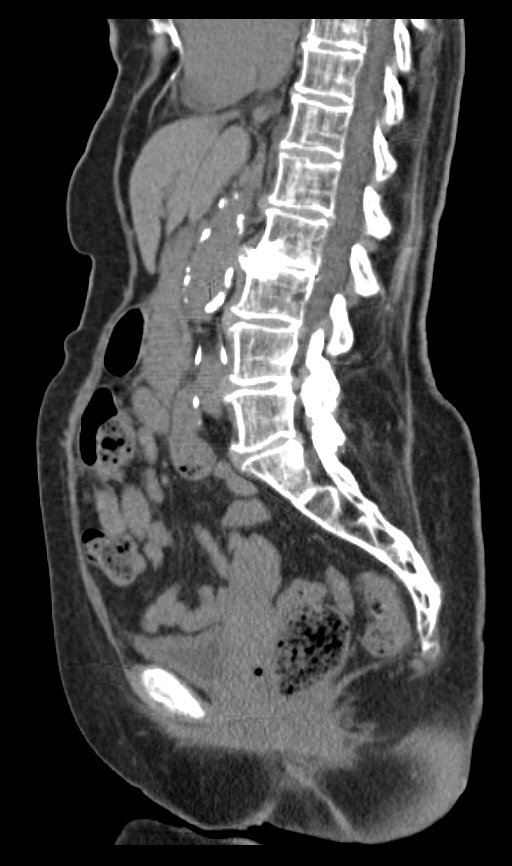

[4 of 16 positions shown; findings below may reference images not displayed]

FINDINGS: Cylindrical bronchiectasis in the right lower lobe with peripheral
coarsened interstitial accentuation in the lower lobes, right
greater than left, favoring fibrosis.

Pectus excavatum contributes to the enlarged transverse diameter of
the heart although there is likely some degree of mild cardiomegaly
is well. Small hiatal hernia. The noncontrast CT appearance of the
liver, spleen, pancreas, and adrenal glands is within normal limits.
Infrarenal IVC filter observed. Aortoiliac atherosclerotic vascular
disease. Gallbladder grossly unremarkable

Fluid density left kidney upper pole 4.9 x 4.6 cm lesion, previously
measured 4.4 x 3.9 cm on 01/03/2008. Fluid density right renal
pelvis lesion 1.7 x 1.2 cm.

Staple line along the cecum with prior appendectomy. Uterine and
adnexal contours unremarkable. Urinary bladder relatively
decompressed but unremarkable.

No renal calculi or ureteral calculus. No bladder calculus
identified.

There is loss of disc height and endplate sclerosis at the L2-3
level. Degenerative disc disease noted at L3-4, L4-5, and L5-S1.
Ligamentum flavum redundancy or synovial cyst contributes to right
foraminal stenosis at L4-5.
IMPRESSION: 1. A specific cause for the patient's acute right flank pain is not
identified on today's noncontrast CT.
2. Cylindrical bronchiectasis in the right lower lobe, with
bibasilar fibrosis.
3. Mild cardiomegaly.  Pectus excavatum noted.
4. Small hiatal hernia.
5. Fluid density renal lesions favor cysts.
6. Lumbar spondylosis and degenerative disc disease.
